# Patient Record
Sex: Female | Born: 1995 | Race: Black or African American | Hispanic: No | Marital: Single | Smoking: Never smoker
Health system: Southern US, Community
[De-identification: ages and names within clinical notes are randomized; demographics above are authoritative.]

## PROBLEM LIST (undated history)

## (undated) DIAGNOSIS — N912 Amenorrhea, unspecified: Secondary | ICD-10-CM

## (undated) DIAGNOSIS — Z8619 Personal history of other infectious and parasitic diseases: Secondary | ICD-10-CM

## (undated) HISTORY — DX: Personal history of other infectious and parasitic diseases: Z86.19

## (undated) HISTORY — PX: WISDOM TOOTH EXTRACTION: SHX21

## (undated) HISTORY — DX: Amenorrhea, unspecified: N91.2

---
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
## 2016-01-18 ENCOUNTER — Emergency Department (HOSPITAL_COMMUNITY)
Admission: EM | Admit: 2016-01-18 | Discharge: 2016-01-19 | Disposition: A | Discharge status: Home / Self Care | Payer: Medicaid Other | Attending: Emergency Medicine | Admitting: Emergency Medicine

## 2016-01-18 ENCOUNTER — Encounter (HOSPITAL_COMMUNITY): Payer: Self-pay | Admitting: *Deleted

## 2016-01-18 DIAGNOSIS — L02415 Cutaneous abscess of right lower limb: Secondary | ICD-10-CM | POA: Diagnosis present

## 2016-01-18 DIAGNOSIS — L03115 Cellulitis of right lower limb: Secondary | ICD-10-CM | POA: Insufficient documentation

## 2016-01-18 NOTE — ED Notes (Signed)
The pt is c/o an abscess for 3 days on her rt thigh laterally  lmp  None depo

## 2016-01-18 NOTE — ED Provider Notes (Signed)
CSN: [id number]     Arrival date & time 01/18/16  2316 History  By signing my name below, I, Bridgette HabermannMaria Tan, attest that this documentation has been prepared under the direction and in the presence of Dreshaun Stene, PA-C. Electronically Signed: Bridgette HabermannMaria Tan, ED Scribe. 01/18/2016. 11:49 PM.   Chief Complaint  Patient presents with  . Abscess    The history is provided by the patient. No language interpreter was used.    HPI Comments: Elizabeth Mercer is a 20 y.o. female who presents to the Emergency Department complaining of a tender, gradually enlarging skin redness on her right thigh onset 3 days ago. She states "I think I have a staph infection". Denies hx of staph infections or recurrent skin infections. She reports a small bump that has scabbed over on her posterior thigh. She denies drainage from the scab. She reports redness of the skin around the scab that has been gradually growing in size. Denies streaking. Unsure of recent bug bites or wounds to the leg. Pt does shave her legs in this area but is unsure if she has had recent razor injury. Pt has kept a band-aid on the area. She has not taken any OTC medications. Pt denies fevers, chills, nausea or vomiting. No immunocompromising disease. No other complaints today.   PCP: Tanna FurryHout, Brittany  History reviewed. No pertinent past medical history. History reviewed. No pertinent past surgical history. No family history on file. Social History  Substance Use Topics  . Smoking status: Never Smoker   . Smokeless tobacco: None  . Alcohol Use: No   OB History    No data available     Review of Systems  Constitutional: Negative for fever.  Skin: Positive for color change.  All other systems reviewed and are negative.   Allergies  Review of patient's allergies indicates no known allergies.  Home Medications   Prior to Admission medications   Medication Sig Start Date End Date Taking? Authorizing Provider  cephALEXin (KEFLEX) 500 MG capsule  Take 1 capsule (500 mg total) by mouth 2 (two) times daily. 01/19/16   Arlanda Shiplett, PA-C   BP 101/71 mmHg  Pulse 63  Temp(Src) 98.3 F (36.8 C)  Resp 16  Ht 5\' 1"  (1.549 m)  Wt 45.53 kg  BMI 18.98 kg/m2  SpO2 99% Physical Exam  Constitutional: She appears well-developed and well-nourished. No distress.  Nontoxic appearing  HENT:  Head: Normocephalic and atraumatic.  Right Ear: External ear normal.  Left Ear: External ear normal.  Eyes: Conjunctivae are normal. Right eye exhibits no discharge. Left eye exhibits no discharge. No scleral icterus.  Neck: Normal range of motion.  Cardiovascular: Normal rate.   Pulmonary/Chest: Effort normal.  Musculoskeletal: Normal range of motion.  Moves all extremities spontaneously  Neurological: She is alert. Coordination normal.  Skin: Skin is warm and dry.  1-2 mm circular scab noted to right posterior thigh just proximal to popliteal fossa. Very faint surrounding erythema of approximately 4 cm. Mildly TTP over central scab. No fluctuance, drainage, induration, streaking, vesicle, pustule or desquamation. No targetoid lesions. No abscess that I&D would be possible.   Psychiatric: She has a normal mood and affect. Her behavior is normal.  Nursing note and vitals reviewed.   ED Course  Procedures  DIAGNOSTIC STUDIES: Oxygen Saturation is 99% on RA, normal by my interpretation.    COORDINATION OF CARE: 11:47 PM Discussed treatment plan with pt at bedside which includes Rx of antibiotics and PCP follow-up and pt agreed  to plan.   MDM   Final diagnoses:  Cellulitis of right lower extremity   Pt presenting with skin rash consistent with cellulitis. Localized area of erythema without streaking, papules, vesicles or desquamation. Pt is without gross abscess for which I&D would be possible.  Area marked and pt encouraged to return if redness begins to streak, extends beyond the markings, fever or nausea/vomiting develop.  Pt is alert, oriented,  NAD, afebrile, non tachycardic, nonseptic and nontoxic appearing. Pt to be d/c on keflex with strict f/u instructions.    I personally performed the services described in this documentation, which was scribed in my presence. The recorded information has been reviewed and is accurate.     Rolm GalaStevi Adrien Shankar, PA-C 01/19/16 0012  Tomasita CrumbleAdeleke Oni, MD 01/19/16 [id number]

## 2016-01-19 MED ORDER — CEPHALEXIN 500 MG PO CAPS: 500.0000 mg | ORAL_CAPSULE | Freq: Two times a day (BID) | ORAL | Status: DC

## 2016-01-19 NOTE — Discharge Instructions (Signed)

## 2016-01-27 ENCOUNTER — Encounter (HOSPITAL_COMMUNITY): Payer: Self-pay | Admitting: *Deleted

## 2016-01-27 DIAGNOSIS — N12 Tubulo-interstitial nephritis, not specified as acute or chronic: Secondary | ICD-10-CM | POA: Diagnosis not present

## 2016-01-27 DIAGNOSIS — M545 Low back pain: Secondary | ICD-10-CM | POA: Diagnosis present

## 2016-01-27 LAB — COMPREHENSIVE METABOLIC PANEL
ALT: 15 U/L (ref 14–54)
AST: 32 U/L (ref 15–41)
Albumin: 4.2 g/dL (ref 3.5–5.0)
Alkaline Phosphatase: 75 U/L (ref 38–126)
Anion gap: 12 (ref 5–15)
BUN: 10 mg/dL (ref 6–20)
CO2: 21 mmol/L — AB (ref 22–32)
Calcium: 9.3 mg/dL (ref 8.9–10.3)
Chloride: 103 mmol/L (ref 101–111)
Creatinine, Ser: 0.91 mg/dL (ref 0.44–1.00)
GFR calc Af Amer: >60 mL/min (ref >60)
Glucose, Bld: 105 mg/dL — ABNORMAL HIGH (ref 65–99)
POTASSIUM: 3.3 mmol/L — AB (ref 3.5–5.1)
SODIUM: 136 mmol/L (ref 135–145)
Total Bilirubin: 0.8 mg/dL (ref 0.3–1.2)
Total Protein: 8.2 g/dL — ABNORMAL HIGH (ref 6.5–8.1)

## 2016-01-27 LAB — CBC WITH DIFFERENTIAL/PLATELET
Basophils Absolute: 0 10*3/uL (ref 0.0–0.1)
Basophils Relative: 0 %
EOS ABS: 0 10*3/uL (ref 0.0–0.7)
Eosinophils Relative: 0 %
HEMATOCRIT: 38.7 % (ref 36.0–46.0)
HEMOGLOBIN: 13.1 g/dL (ref 12.0–15.0)
Lymphocytes Relative: 15 %
Lymphs Abs: 0.9 10*3/uL (ref 0.7–4.0)
MCH: 29 pg (ref 26.0–34.0)
MCHC: 33.9 g/dL (ref 30.0–36.0)
MCV: 85.8 fL (ref 78.0–100.0)
Monocytes Absolute: 0.7 10*3/uL (ref 0.1–1.0)
Monocytes Relative: 11 %
NEUTROS ABS: 4.5 10*3/uL (ref 1.7–7.7)
Neutrophils Relative %: 74 %
Platelets: 261 10*3/uL (ref 150–400)
RBC: 4.51 MIL/uL (ref 3.87–5.11)
RDW: 13.3 % (ref 11.5–15.5)
WBC: 6 10*3/uL (ref 4.0–10.5)

## 2016-01-27 MED ORDER — IBUPROFEN 400 MG PO TABS
600.0000 mg | ORAL_TABLET | Freq: Once | ORAL | Status: AC
Start: 2016-01-28 — End: 2016-01-27
  Administered 2016-01-27: 600 mg via ORAL

## 2016-01-27 MED ORDER — OXYCODONE-ACETAMINOPHEN 5-325 MG PO TABS: 1.0000 | ORAL_TABLET | Freq: Once | ORAL | Status: DC

## 2016-01-27 MED ORDER — ACETAMINOPHEN 325 MG PO TABS
ORAL_TABLET | ORAL | Status: AC
  Filled 2016-01-27: qty 1

## 2016-01-27 MED ORDER — OXYCODONE-ACETAMINOPHEN 5-325 MG PO TABS
ORAL_TABLET | ORAL | Status: AC
  Filled 2016-01-27: qty 1

## 2016-01-27 MED ORDER — IBUPROFEN 200 MG PO TABS
ORAL_TABLET | ORAL | Status: AC
  Filled 2016-01-27: qty 3

## 2016-01-27 MED ORDER — ACETAMINOPHEN 325 MG PO TABS
650.0000 mg | ORAL_TABLET | Freq: Once | ORAL | Status: AC | PRN
  Administered 2016-01-27: 650 mg via ORAL

## 2016-01-27 NOTE — ED Notes (Signed)
Pt calm and resting quietly in triage room with friend at bedside

## 2016-01-27 NOTE — ED Notes (Signed)
Pt c/o sudden lower back pain, pt having panic attack in waiting room and triage. Pt is able to bend forward, arch her back, twist left and right without any problems.

## 2016-01-28 ENCOUNTER — Emergency Department (HOSPITAL_COMMUNITY)
Admission: EM | Admit: 2016-01-28 | Discharge: 2016-01-28 | Disposition: A | Discharge status: Home / Self Care | Payer: Medicaid Other | Attending: Emergency Medicine | Admitting: Emergency Medicine

## 2016-01-28 DIAGNOSIS — N12 Tubulo-interstitial nephritis, not specified as acute or chronic: Secondary | ICD-10-CM

## 2016-01-28 LAB — I-STAT CG4 LACTIC ACID, ED
Lactic Acid, Venous: 0.62 mmol/L (ref 0.5–1.9)
Lactic Acid, Venous: 1.96 mmol/L (ref 0.5–1.9)

## 2016-01-28 LAB — URINALYSIS, ROUTINE W REFLEX MICROSCOPIC
Bilirubin Urine: NEGATIVE
GLUCOSE, UA: NEGATIVE mg/dL
HGB URINE DIPSTICK: NEGATIVE
Ketones, ur: 15 mg/dL — AB
Nitrite: NEGATIVE
PH: 6.5 (ref 5.0–8.0)
Protein, ur: NEGATIVE mg/dL
Specific Gravity, Urine: 1.026 (ref 1.005–1.030)

## 2016-01-28 LAB — URINE MICROSCOPIC-ADD ON: RBC / HPF: NONE SEEN RBC/hpf (ref 0–5)

## 2016-01-28 LAB — PREGNANCY, URINE: PREG TEST UR: NEGATIVE

## 2016-01-28 MED ORDER — SODIUM CHLORIDE 0.9 % IV BOLUS (SEPSIS)
1000.0000 mL | Freq: Once | INTRAVENOUS | Status: AC
  Administered 2016-01-28: 1000 mL via INTRAVENOUS

## 2016-01-28 MED ORDER — DEXTROSE 5 % IV SOLN
1.0000 g | Freq: Once | INTRAVENOUS | Status: AC
  Administered 2016-01-28: 1 g via INTRAVENOUS
  Filled 2016-01-28: qty 10

## 2016-01-28 MED ORDER — CEPHALEXIN 500 MG PO CAPS: 500.0000 mg | ORAL_CAPSULE | Freq: Three times a day (TID) | ORAL | Status: DC

## 2016-01-28 NOTE — Discharge Instructions (Signed)

## 2016-01-28 NOTE — ED Provider Notes (Signed)
CSN: [id number]     Arrival date & time 01/27/16  2147 History  By signing my name below, I, Terrance Branch, attest that this documentation has been prepared under the direction and in the presence of Shon Baton, MD. Electronically Signed: Evon Slack, ED Scribe. 01/28/2016. 2:13 AM.    Chief Complaint  Patient presents with  . Back Pain  . Fever   Patient is a 20 y.o. female presenting with back pain and fever. The history is provided by the patient. No language interpreter was used.  Back Pain Associated symptoms: fever   Associated symptoms: no abdominal pain, no chest pain and no dysuria   Fever Associated symptoms: no chest pain, no diarrhea, no dysuria, no nausea, no sore throat and no vomiting    HPI Comments: Elizabeth Mercer is a 20 y.o. female who presents to the Emergency Department complaining of worsening low right sided back pain yesterday morning. Pt reports having HA as well. Pt states that since being in the ED she has received ibuprofen and her pain and HA has resolved. She is currently without complaint and resting comfortably. States that her back pain got progressively throughout the day.  Denies recent sick contacts. Denies dysuria, hematuria, CP, SOB, n/v/d, abdominal pain, sore throat or congestion. Denies alcohol or drug use. Denies difficulty with her bowel or bladder, weakness, numbness, tingling of the lower extremities. Denies IV drug use. She did not realize she had a fever. Noted to be febrile in triage.  History reviewed. No pertinent past medical history. History reviewed. No pertinent past surgical history. History reviewed. No pertinent family history. Social History  Substance Use Topics  . Smoking status: Never Smoker   . Smokeless tobacco: None  . Alcohol Use: No   OB History    No data available      Review of Systems  Constitutional: Positive for fever.  HENT: Negative for sore throat.   Respiratory: Negative for shortness of  breath.   Cardiovascular: Negative for chest pain.  Gastrointestinal: Negative for nausea, vomiting, abdominal pain and diarrhea.  Genitourinary: Negative for dysuria and hematuria.  Musculoskeletal: Positive for back pain. Negative for neck pain and neck stiffness.  All other systems reviewed and are negative.    Allergies  Review of patient's allergies indicates no known allergies.  Home Medications   Prior to Admission medications   Medication Sig Start Date End Date Taking? Authorizing Provider  cephALEXin (KEFLEX) 500 MG capsule Take 1 capsule (500 mg total) by mouth 3 (three) times daily. 01/28/16   Shon Baton, MD   BP 90/55 mmHg  Pulse 79  Temp(Src) 99 F (37.2 C) (Oral)  Resp 18  SpO2 100%   Physical Exam  Constitutional: She is oriented to person, place, and time. No distress.  Resting comfortably, no acute distress  HENT:  Head: Normocephalic and atraumatic.  Eyes: Pupils are equal, round, and reactive to light.  Neck: Normal range of motion. Neck supple.  Normal range of motion, no meningismus  Cardiovascular: Normal rate, regular rhythm and normal heart sounds.   No murmur heard. Pulmonary/Chest: Effort normal and breath sounds normal. No respiratory distress. She has no wheezes.  Abdominal: Soft. Bowel sounds are normal. There is no tenderness. There is no rebound.  Genitourinary:  No CVA tenderness  Neurological: She is alert and oriented to person, place, and time.  Skin: Skin is warm and dry.  Psychiatric: She has a normal mood and affect.  Nursing note and vitals  reviewed.   ED Course  Procedures (including critical care time) DIAGNOSTIC STUDIES: Oxygen Saturation is 100% on RA, normal by my interpretation.    COORDINATION OF CARE: 2:20 AM-Discussed treatment plan which includes CMP and UA with pt at bedside and pt agreed to plan.     Labs Review Labs Reviewed  COMPREHENSIVE METABOLIC PANEL - Abnormal; Notable for the following:     Potassium 3.3 (*)    CO2 21 (*)    Glucose, Bld 105 (*)    Total Protein 8.2 (*)    All other components within normal limits  URINALYSIS, ROUTINE W REFLEX MICROSCOPIC (NOT AT [facility]) - Abnormal; Notable for the following:    APPearance CLOUDY (*)    Ketones, ur 15 (*)    Leukocytes, UA MODERATE (*)    All other components within normal limits  URINE MICROSCOPIC-ADD ON - Abnormal; Notable for the following:    Squamous Epithelial / LPF 0-5 (*)    Bacteria, UA RARE (*)    All other components within normal limits  I-STAT CG4 LACTIC ACID, ED - Abnormal; Notable for the following:    Lactic Acid, Venous 1.96 (*)    All other components within normal limits  URINE CULTURE  URINE CULTURE  CBC WITH DIFFERENTIAL/PLATELET  PREGNANCY, URINE  POC URINE PREG, ED  I-STAT CG4 LACTIC ACID, ED  I-STAT CG4 LACTIC ACID, ED    Imaging Review No results found. I have personally reviewed and evaluated these images and lab results as part of my medical decision-making.   EKG Interpretation None      MDM   Final diagnoses:  Pyelonephritis    Patient presents with back pain. Ongoing worsening over the last day. Was noted to be febrile in triage. Initial temperature 101.4. Subsequent temperature 102.8. Noted to be tachycardic. She was given ibuprofen and Tylenol in triage. She is currently completely asymptomatic. Only other associated symptoms or headache in the setting of fever. No neck stiffness. No upper respiratory symptoms. No abdominal symptoms. Patient was noted to be borderline hypotensive. Her normal blood pressures from 95-105 systolic based on chart review.  She had an isolated low blood pressures 74/48.  Repeat blood pressures closer to baseline. She only weighs 100 pounds. She was given 1 L of fluid. Feel her blood pressures are likely within normal range or just below given acute illness. Lab work is largely reassuring. No leukocytosis.  Urinalysis with moderate leukocyte esterase, 6-30  white cells and rare bacteria. Urine culture sent. Given back pain and fever, will treat for pyelonephritis. Also noted to have ketones in the urine. Blood pressure could reflect mild dehydration. On multiple rechecks, patient remains persistently asymptomatic in his room resting comfortably with a reassuring exam. Will discharge home on Keflex. Patient was given strict return precautions.  After history, exam, and medical workup I feel the patient has been appropriately medically screened and is safe for discharge home. Pertinent diagnoses were discussed with the patient. Patient was given return precautions.  I personally performed the services described in this documentation, which was scribed in my presence. The recorded information has been reviewed and is accurate.      Shon Batonourtney F Horton, MD 01/28/16 [phone]

## 2016-01-29 LAB — URINE CULTURE

## 2016-03-22 ENCOUNTER — Emergency Department (HOSPITAL_COMMUNITY)
Admission: EM | Admit: 2016-03-22 | Discharge: 2016-03-22 | Disposition: A | Discharge status: Home / Self Care | Payer: Medicaid Other | Attending: Emergency Medicine | Admitting: Emergency Medicine

## 2016-03-22 ENCOUNTER — Encounter (HOSPITAL_COMMUNITY): Payer: Self-pay | Admitting: Vascular Surgery

## 2016-03-22 DIAGNOSIS — N939 Abnormal uterine and vaginal bleeding, unspecified: Secondary | ICD-10-CM | POA: Diagnosis present

## 2016-03-22 DIAGNOSIS — R102 Pelvic and perineal pain: Secondary | ICD-10-CM | POA: Diagnosis not present

## 2016-03-22 LAB — I-STAT CHEM 8, ED
BUN: 11 mg/dL (ref 6–20)
CALCIUM ION: 1.18 mmol/L (ref 1.15–1.40)
CREATININE: 1 mg/dL (ref 0.44–1.00)
Chloride: 103 mmol/L (ref 101–111)
GLUCOSE: 78 mg/dL (ref 65–99)
HCT: 36 % (ref 36.0–46.0)
HEMOGLOBIN: 12.2 g/dL (ref 12.0–15.0)
Potassium: 3.6 mmol/L (ref 3.5–5.1)
Sodium: 139 mmol/L (ref 135–145)
TCO2: 24 mmol/L (ref 0–100)

## 2016-03-22 LAB — WET PREP, GENITAL
Clue Cells Wet Prep HPF POC: NONE SEEN
Sperm: NONE SEEN
Trich, Wet Prep: NONE SEEN
YEAST WET PREP: NONE SEEN

## 2016-03-22 LAB — I-STAT BETA HCG BLOOD, ED (MC, WL, AP ONLY): I-stat hCG, quantitative: <5 m[IU]/mL (ref <5)

## 2016-03-22 NOTE — ED Provider Notes (Signed)
[facility]EMERGENCY DEPT Provider Note   CSN: [id number] Arrival date & time: 03/22/16  1605     History   Chief Complaint Chief Complaint  Patient presents with  . Vaginal Bleeding    HPI Elizabeth Mercer is a 20 y.o. female.  Patient presents to the ED with a chief complaint of vaginal bleeding.  She states that she noticed the bleeding 3-4 days ago and that it has been persistent.  She does not use pads or tampons.  She denies any SOB, lightheadedness, dizziness.  She states that she has not had a period in over a year because she takes depo.  She states that she had some cramping at the onset of the bleeding, but does not have any pain now.  She denies any dysuria.   The history is provided by the patient. No language interpreter was used.    History reviewed. No pertinent past medical history.  There are no active problems to display for this patient.   History reviewed. No pertinent surgical history.  OB History    No data available       Home Medications    Prior to Admission medications   Medication Sig Start Date End Date Taking? Authorizing Provider  cephALEXin (KEFLEX) 500 MG capsule Take 1 capsule (500 mg total) by mouth 3 (three) times daily. 01/28/16   Shon Baton, MD    Family History No family history on file.  Social History Social History  Substance Use Topics  . Smoking status: Never Smoker  . Smokeless tobacco: Never Used  . Alcohol use No     Allergies   Review of patient's allergies indicates no known allergies.   Review of Systems Review of Systems  Genitourinary: Positive for vaginal bleeding.  All other systems reviewed and are negative.    Physical Exam Updated Vital Signs BP 116/84 (BP Location: Right Arm)   Pulse 99   Temp 98.9 F (37.2 C) (Oral)   Resp 20   SpO2 100%   Physical Exam  Constitutional: She is oriented to person, place, and time. She appears well-developed and well-nourished.  HENT:  Head:  Normocephalic and atraumatic.  Eyes: Conjunctivae and EOM are normal. Pupils are equal, round, and reactive to light.  Neck: Normal range of motion. Neck supple.  Cardiovascular: Normal rate and regular rhythm.  Exam reveals no gallop and no friction rub.   No murmur heard. Pulmonary/Chest: Effort normal and breath sounds normal. No respiratory distress. She has no wheezes. She has no rales. She exhibits no tenderness.  Abdominal: Soft. Bowel sounds are normal. She exhibits no distension and no mass. There is no tenderness. There is no rebound and no guarding.  Genitourinary:  Genitourinary Comments: Pelvic exam chaperoned by female ER tech, no right or left adnexal tenderness, no uterine tenderness, no vaginal discharge, no bleeding, no CMT or friability, no foreign body, no injury to the external genitalia, no other significant findings   Musculoskeletal: Normal range of motion. She exhibits no edema or tenderness.  Neurological: She is alert and oriented to person, place, and time.  Skin: Skin is warm and dry.  Psychiatric: She has a normal mood and affect. Her behavior is normal. Judgment and thought content normal.  Nursing note and vitals reviewed.    ED Treatments / Results  Labs (all labs ordered are listed, but only abnormal results are displayed) Labs Reviewed  WET PREP, GENITAL  URINALYSIS, ROUTINE W REFLEX MICROSCOPIC (NOT AT [facility])  I-STAT CHEM 8,  ED  GC/CHLAMYDIA PROBE AMP ([place]) NOT AT [facility]    EKG  EKG Interpretation None       Radiology No results found.  Procedures Procedures (including critical care time)  Medications Ordered in ED Medications - No data to display   Initial Impression / Assessment and Plan / ED Course  I have reviewed the triage vital signs and the nursing notes.  Pertinent labs & imaging results that were available during my care of the patient were reviewed by me and considered in my medical decision making (see chart for  details).  Clinical Course    Patient with abnormal uterine bleeding.  No bleeding on exam.  No pain. Preg negative.  H/H is stable.  DC to home with PCP follow-up.  Final Clinical Impressions(s) / ED Diagnoses   Final diagnoses:  Abnormal uterine bleeding (AUB)    New Prescriptions New Prescriptions   No medications on file     Roxy HorsemanRobert Beverlyann Broxterman, PA-C 03/22/16 1903    Maia PlanJoshua G Long, MD 03/23/16 1728

## 2016-03-22 NOTE — ED Triage Notes (Signed)
Pt reports to the ED for eval of vaginal bleeding x 3-4 days. She reports she also had some vaginal pain at onset. Pt reports she does not think it is her menstrual cycle because she hasn't had a period since she stopped taking the Depo shot in November 2016. Pt denies any recent unprotected sex. Denies any abd pain or N/V/D.

## 2016-03-22 NOTE — ED Notes (Signed)
Pt ambulated to room from waiting room. Pt was unable to void at this time. Pelvic cart setup at bedside.

## 2016-03-23 LAB — GC/CHLAMYDIA PROBE AMP (~~LOC~~) NOT AT ARMC
Chlamydia: POSITIVE — AB
NEISSERIA GONORRHEA: POSITIVE — AB

## 2016-03-24 ENCOUNTER — Telehealth (HOSPITAL_BASED_OUTPATIENT_CLINIC_OR_DEPARTMENT_OTHER): Payer: Self-pay | Admitting: Emergency Medicine

## 2016-03-24 NOTE — Telephone Encounter (Signed)
Chart handoff to EDP for treatment plan for GC and Chlamydia 

## 2016-03-25 ENCOUNTER — Telehealth (HOSPITAL_BASED_OUTPATIENT_CLINIC_OR_DEPARTMENT_OTHER): Payer: Self-pay | Admitting: *Deleted

## 2016-03-25 NOTE — Telephone Encounter (Signed)
Spoke with patient, verified ID, informed of labs, advised return to ED/health department for treatment and notify sexual partners for evaluation and treatment.

## 2016-12-31 ENCOUNTER — Emergency Department (HOSPITAL_COMMUNITY)
Admission: EM | Admit: 2016-12-31 | Discharge: 2016-12-31 | Disposition: A | Discharge status: Home / Self Care | Payer: Medicaid Other | Attending: Emergency Medicine | Admitting: Emergency Medicine

## 2016-12-31 ENCOUNTER — Encounter (HOSPITAL_COMMUNITY): Payer: Self-pay

## 2016-12-31 DIAGNOSIS — Z3202 Encounter for pregnancy test, result negative: Secondary | ICD-10-CM | POA: Insufficient documentation

## 2016-12-31 DIAGNOSIS — R112 Nausea with vomiting, unspecified: Secondary | ICD-10-CM | POA: Diagnosis not present

## 2016-12-31 LAB — HCG, SERUM, QUALITATIVE: Preg, Serum: NEGATIVE

## 2016-12-31 LAB — POC URINE PREG, ED: PREG TEST UR: NEGATIVE

## 2016-12-31 NOTE — Discharge Instructions (Signed)
Your urine and blood tests were negative today.   As we discussed, it could still be too early to completely rule out a pregnancy (although it is very unlikely).  Monitor for your next menstrual cycle, if you get it on time you can be more sure you are not pregnant. If you miss your period, retake another pregnancy test at home.   Return to ED if you develop a positive pregnancy test with severe abdominal pain and vaginal bleeding.   Contact the clinics listed below to establish care with a medical provider to discuss birth control options to prevent unwanted pregnancies and sexual transmitted diseases.

## 2016-12-31 NOTE — ED Triage Notes (Signed)
Pt reports she wants to know if she is pregnant. She states she took a home pregnancy test which came back positive. LMP may 18th. Denies any pain.

## 2016-12-31 NOTE — ED Provider Notes (Signed)
[facility] Provider Note   CSN: [id number] Arrival date & time: 12/31/16  4098     History   Chief Complaint Chief Complaint  Patient presents with  . Possible Pregnancy    HPI Elizabeth Mercer is a 21 y.o. female with no past medical history presents to the ED with a positive pregnancy test at home. She is sexually active with men only without condom or any other birth control methods. She is having intermittent, mild nausea and cramping for the past week, she vomited once yesterday. Denies fevers, chest pain, abdominal pain, pelvic pain, urinary symptoms, vaginal bleeding. Frequent use of marijuana, denies EtOH or tobacco use. LMP 12/03/2016-12/06/2016.  HPI  History reviewed. No pertinent past medical history.  There are no active problems to display for this patient.   History reviewed. No pertinent surgical history.  OB History    No data available       Home Medications    Prior to Admission medications   Not on File    Family History No family history on file.  Social History Social History  Substance Use Topics  . Smoking status: Never Smoker  . Smokeless tobacco: Never Used  . Alcohol use No     Allergies   Patient has no known allergies.   Review of Systems Review of Systems  Constitutional: Negative for fever.  Cardiovascular: Negative for chest pain.  Gastrointestinal: Positive for nausea and vomiting. Negative for abdominal pain, constipation and diarrhea.  Genitourinary: Negative for difficulty urinating, dysuria, flank pain, frequency, hematuria and pelvic pain.       +cramping  Musculoskeletal: Negative for back pain.  Skin: Negative for color change.  Neurological: Negative for syncope, light-headedness and headaches.     Physical Exam Updated Vital Signs BP 110/75 (BP Location: Right Arm)   Pulse 82   Temp 98.3 F (36.8 C) (Oral)   Resp 16   Ht 5\' 1"  (1.549 m)   Wt 45.4 kg (100 lb)   LMP 12/03/2016   SpO2 100%   BMI  18.89 kg/m   Physical Exam  Constitutional: She is oriented to person, place, and time. She appears well-developed and well-nourished. No distress.  NAD.  HENT:  Head: Normocephalic and atraumatic.  Right Ear: External ear normal.  Left Ear: External ear normal.  Nose: Nose normal.  Eyes: Conjunctivae and EOM are normal. No scleral icterus.  Neck: Normal range of motion. Neck supple.  Cardiovascular: Normal rate, regular rhythm and normal heart sounds.   No murmur heard. Pulmonary/Chest: Effort normal and breath sounds normal. She has no wheezes.  Musculoskeletal: Normal range of motion. She exhibits no deformity.  Neurological: She is alert and oriented to person, place, and time.  Skin: Skin is warm and dry. Capillary refill takes less than 2 seconds.  Psychiatric: She has a normal mood and affect. Her behavior is normal. Judgment and thought content normal.  Nursing note and vitals reviewed.    ED Treatments / Results  Labs (all labs ordered are listed, but only abnormal results are displayed) Labs Reviewed  HCG, SERUM, QUALITATIVE  POC URINE PREG, ED    EKG  EKG Interpretation None       Radiology No results found.  Procedures Procedures (including critical care time)  Medications Ordered in ED Medications - No data to display   Initial Impression / Assessment and Plan / ED Course  I have reviewed the triage vital signs and the nursing notes.  Pertinent labs & imaging results  that were available during my care of the patient were reviewed by me and considered in my medical decision making (see chart for details).    21 year old healthy female presents requesting pregnancy test. She took a pregnancy test at home which was positive. She is sexually active with men only and has been expressing mild intermittent nausea, vomited one time. No other constitutional symptoms or abdominal pain, urinary symptoms or vaginal bleeding. Her next menstrual cycle is due in 3  days.  Physical exam is reassuring. No suprapubic, CVA or abdominal tenderness. Urine pregnancy negative. Beta hCG serum negative. Discussed results with patient. She is very relieved. No indication for further emergent lab work or imaging today. Provided patient education on birth control methods to avoid unwanted pregnancy and sexually transmitted diseases. Patient is interested. Provided [facility] and health department contact information. Patient considered safe for discharge at this time.  Final Clinical Impressions(s) / ED Diagnoses   Final diagnoses:  Encounter for pregnancy test with result negative    New Prescriptions There are no discharge medications for this patient.    Liberty HandyGibbons, Rontavious Albright J, PA-C 12/31/16 1023    Margarita Grizzleay, Danielle, MD 12/31/16 [phone]

## 2017-03-17 ENCOUNTER — Emergency Department (HOSPITAL_COMMUNITY)
Admission: EM | Admit: 2017-03-17 | Discharge: 2017-03-17 | Disposition: A | Discharge status: Home / Self Care | Payer: Medicaid Other | Attending: Emergency Medicine | Admitting: Emergency Medicine

## 2017-03-17 ENCOUNTER — Emergency Department (HOSPITAL_COMMUNITY): Payer: Medicaid Other

## 2017-03-17 ENCOUNTER — Encounter (HOSPITAL_COMMUNITY): Payer: Self-pay

## 2017-03-17 DIAGNOSIS — S60111A Contusion of right thumb with damage to nail, initial encounter: Secondary | ICD-10-CM | POA: Insufficient documentation

## 2017-03-17 DIAGNOSIS — W231XXA Caught, crushed, jammed, or pinched between stationary objects, initial encounter: Secondary | ICD-10-CM | POA: Insufficient documentation

## 2017-03-17 DIAGNOSIS — Y939 Activity, unspecified: Secondary | ICD-10-CM | POA: Insufficient documentation

## 2017-03-17 DIAGNOSIS — Y929 Unspecified place or not applicable: Secondary | ICD-10-CM | POA: Insufficient documentation

## 2017-03-17 DIAGNOSIS — Y998 Other external cause status: Secondary | ICD-10-CM | POA: Insufficient documentation

## 2017-03-17 DIAGNOSIS — S60011A Contusion of right thumb without damage to nail, initial encounter: Secondary | ICD-10-CM

## 2017-03-17 DIAGNOSIS — S6010XA Contusion of unspecified finger with damage to nail, initial encounter: Secondary | ICD-10-CM

## 2017-03-17 MED ORDER — IBUPROFEN 600 MG PO TABS: 600.0000 mg | ORAL_TABLET | Freq: Four times a day (QID) | ORAL | 0 refills | Status: DC | PRN

## 2017-03-17 MED ORDER — IBUPROFEN 400 MG PO TABS: 600.0000 mg | ORAL_TABLET | Freq: Once | ORAL | Status: DC

## 2017-03-17 NOTE — Discharge Instructions (Signed)
Read the information below.  Use the prescribed medication as directed.  Please discuss all new medications with your pharmacist.  You may return to the Emergency Department at any time for worsening condition or any new symptoms that concern you.    If you develop uncontrolled pain, weakness or numbness of the extremity, severe discoloration of the skin, or you are unable to move your finger, return to the ER for a recheck.    °

## 2017-03-17 NOTE — ED Provider Notes (Signed)
[facility]-EMERGENCY DEPT Provider Note   CSN: [id number] Arrival date & time: 03/17/17  1034     History   Chief Complaint Chief Complaint  Patient presents with  . Finger Injury    HPI Elizabeth Mercer is a 21 y.o. female.  HPI   Patient presents with injury to her right thumb that occurred yesterday when she accidentally slammed her thumb in the door.  She denies any other injury.  No weakness or numbness.  She works as a Production designer, theatre/television/filmmanager at [employer].    History reviewed. No pertinent past medical history.  There are no active problems to display for this patient.   History reviewed. No pertinent surgical history.  OB History    No data available       Home Medications    Prior to Admission medications   Medication Sig Start Date End Date Taking? Authorizing Provider  ibuprofen (ADVIL,MOTRIN) 600 MG tablet Take 1 tablet (600 mg total) by mouth every 6 (six) hours as needed for mild pain or moderate pain. 03/17/17   Trixie DredgeWest, Helmuth Recupero, PA-C    Family History History reviewed. No pertinent family history.  Social History Social History  Substance Use Topics  . Smoking status: Never Smoker  . Smokeless tobacco: Never Used  . Alcohol use No     Allergies   Patient has no known allergies.   Review of Systems Review of Systems  Constitutional: Negative for chills and fever.  Musculoskeletal: Negative for joint swelling and myalgias.  Skin: Positive for color change. Negative for pallor, rash and wound.  Neurological: Negative for weakness and numbness.  Hematological: Does not bruise/bleed easily.  Psychiatric/Behavioral: Negative for self-injury.     Physical Exam Updated Vital Signs BP 102/67 (BP Location: Left Arm)   Pulse 66   Temp 98.7 F (37.1 C) (Oral)   Resp 12   LMP 03/17/2017   SpO2 100%   Physical Exam  Constitutional: She appears well-developed and well-nourished. No distress.  HENT:  Head: Normocephalic and atraumatic.  Neck: Neck supple.    Pulmonary/Chest: Effort normal.  Musculoskeletal:  Right thumb with edema, tenderness, mild erythema over distal phalanx.  Nailbed with subungual hematoma over approximately 1/5 proximal nail.  No break in skin.  Full AROM of all joints, distal sensation intact, capillary refill < 2 seconds.  No other noted tenderness throughout right upper extremity.    Neurological: She is alert.  Skin: She is not diaphoretic.  Nursing note and vitals reviewed.    ED Treatments / Results  Labs (all labs ordered are listed, but only abnormal results are displayed) Labs Reviewed - No data to display  EKG  EKG Interpretation None       Radiology Dg Finger Thumb Right  Result Date: 03/17/2017 CLINICAL DATA:  21 year old female slammed right thumb in car door today with pain. EXAM: RIGHT THUMB 2+V COMPARISON:  Right hand series 11/26/15. FINDINGS: Bone mineralization is within normal limits. Joint spaces and alignment are stable and within normal limits. The right thumb metacarpal and phalanges appear intact. There does appear to be mild regional soft tissue swelling. Other visible osseous structures appear intact. IMPRESSION: No acute fracture or dislocation identified about the right thumb. Electronically Signed   By: Odessa FlemingH  Hall M.D.   On: 03/17/2017 11:45    Procedures Procedures (including critical care time)  Medications Ordered in ED Medications  ibuprofen (ADVIL,MOTRIN) tablet 600 mg (not administered)     Initial Impression / Assessment and Plan / ED Course  I have reviewed the triage vital signs and the nursing notes.  Pertinent labs & imaging results that were available during my care of the patient were reviewed by me and considered in my medical decision making (see chart for details).     Afebrile, nontoxic patient with injury to her right distal thumb .  No break in skin.  No fracture.  Clnically no compartment syndrome.   Xray negative.   D/C home with finger splint, motrin,  recommendations for ice, elevation.  Discussed result, findings, treatment, and follow up  with patient.  Pt given return precautions.  Pt verbalizes understanding and agrees with plan.      Final Clinical Impressions(s) / ED Diagnoses   Final diagnoses:  Contusion of right thumb without damage to nail, initial encounter  Subungual hematoma of digit of hand, initial encounter    New Prescriptions Discharge Medication List as of 03/17/2017 12:37 PM    START taking these medications   Details  ibuprofen (ADVIL,MOTRIN) 600 MG tablet Take 1 tablet (600 mg total) by mouth every 6 (six) hours as needed for mild pain or moderate pain., Starting Thu 03/17/2017, [address] [place], PA-C 03/17/17 1507    Tilden Fossa, MD 03/18/17 [phone]

## 2017-03-17 NOTE — ED Triage Notes (Signed)
Pt states she slammed her right thumb in the car door today. Some bruising under the nail noted. Moderate radial pulse noted.

## 2017-07-17 IMAGING — CR DG CHEST 2V
2 series · 2 of 2 positions shown · non-contrast
Comparison: None.

CLINICAL DATA: Chest pain and abdominal pain since 6 p.m. tonight.

EXAM:
CHEST  2 VIEW

[chest pa]
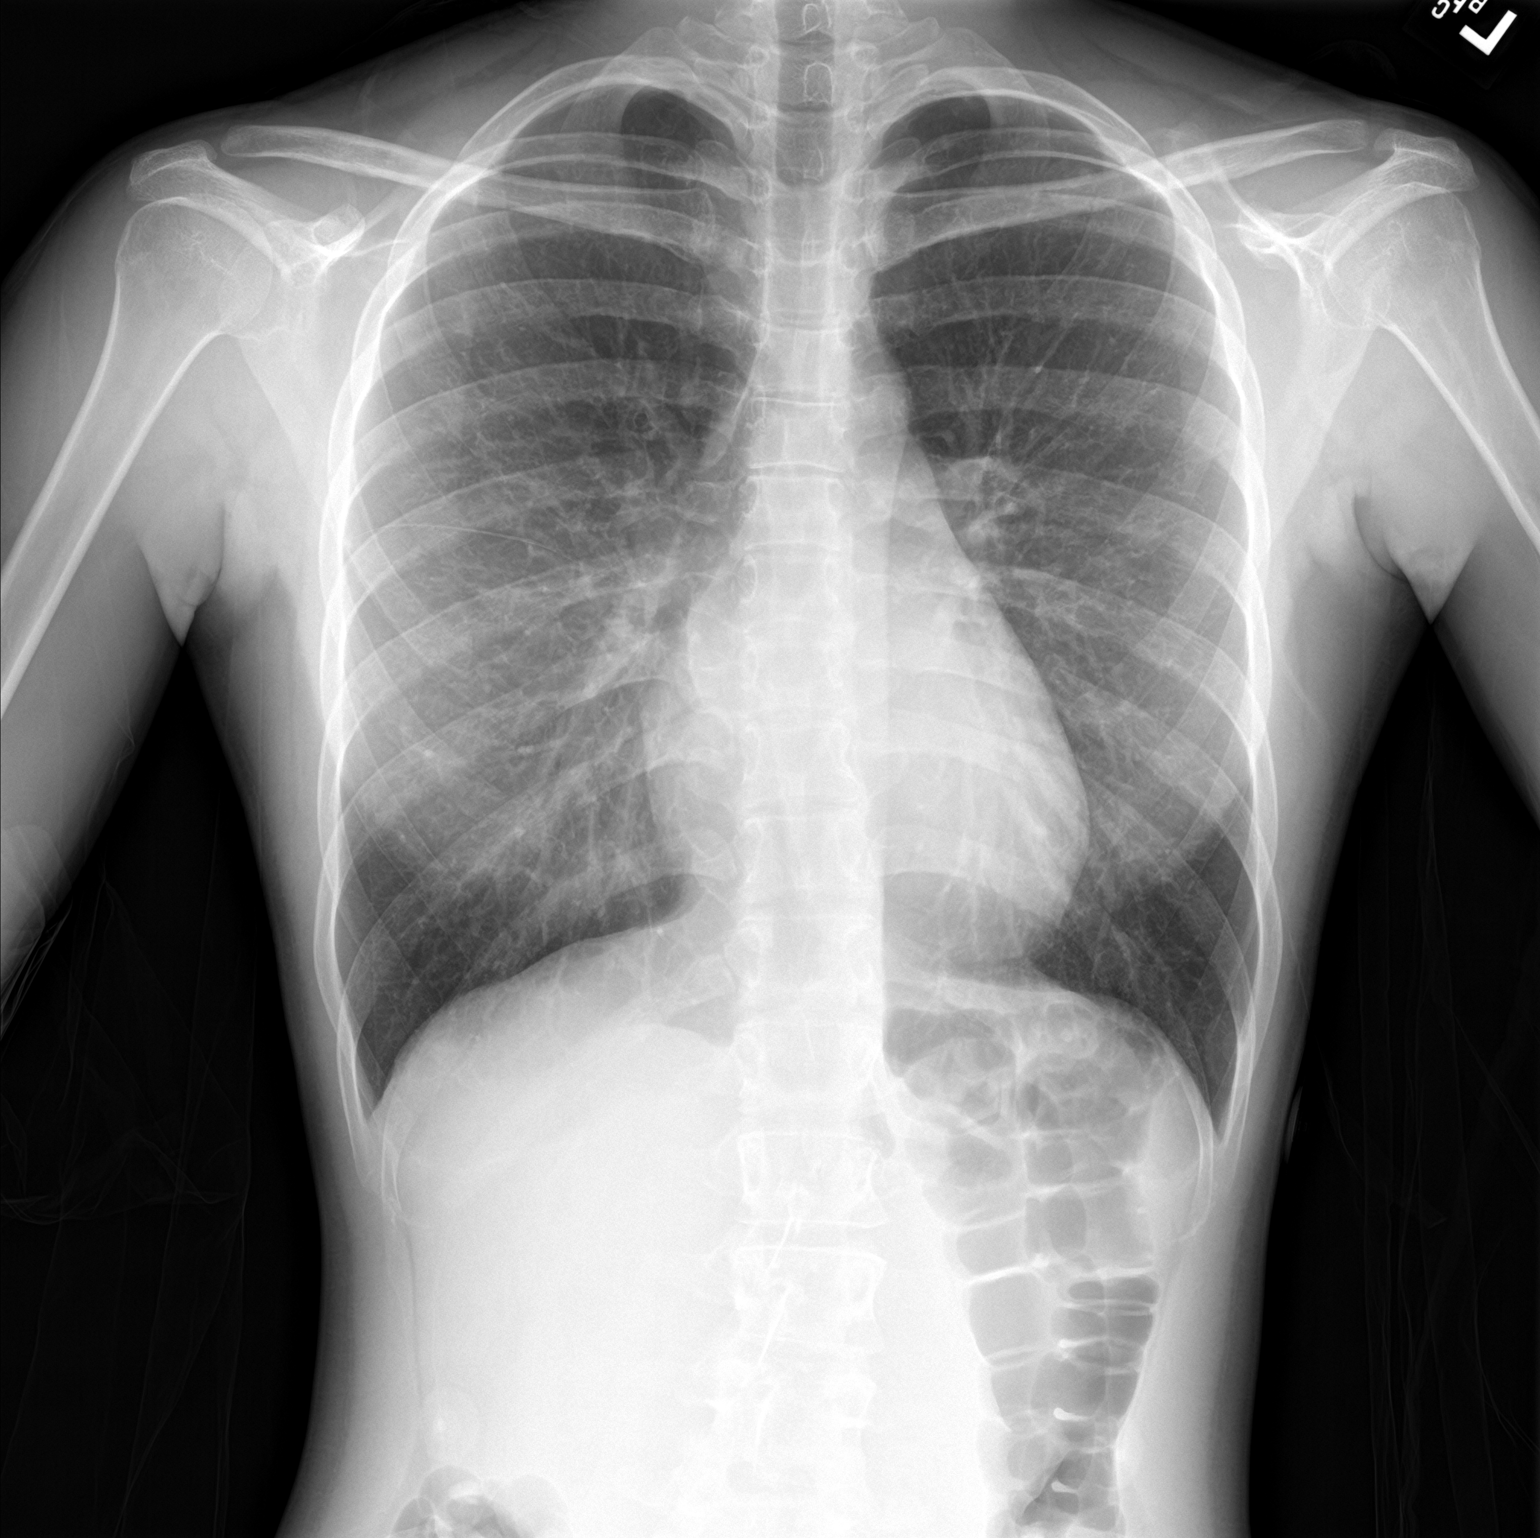

[chest lat]
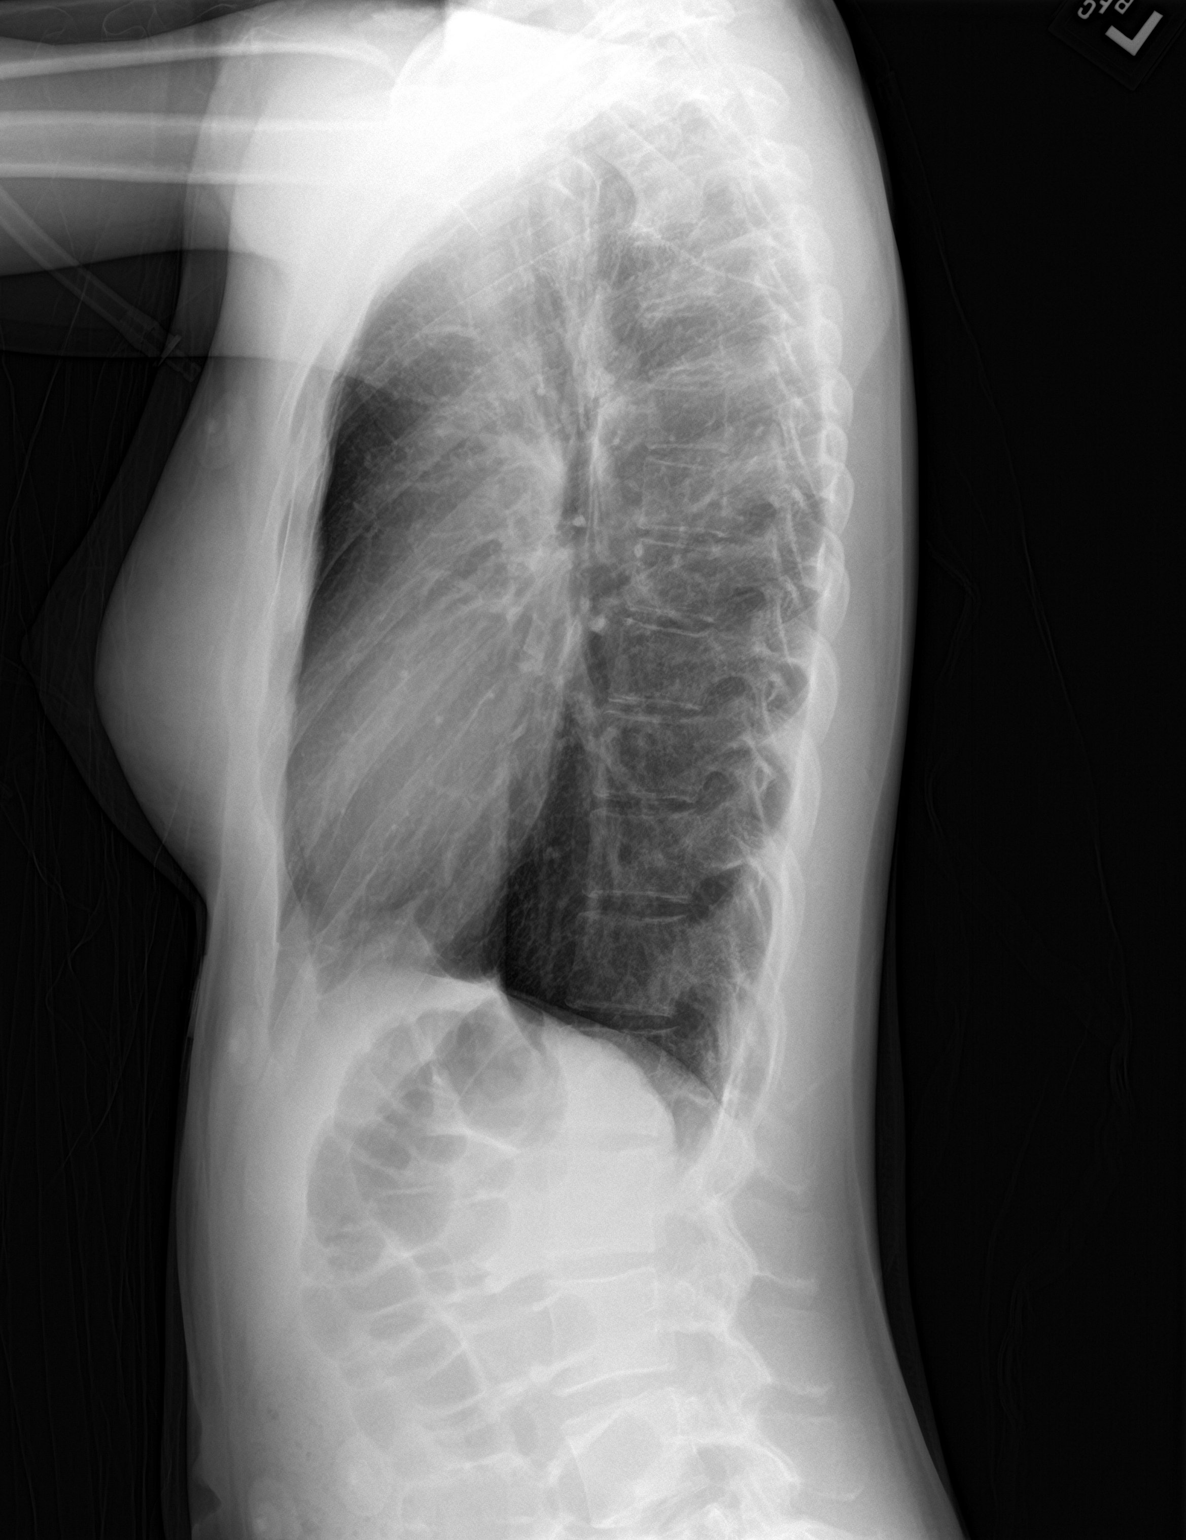

[2 of 2 positions shown; findings below may reference images not displayed]

FINDINGS: The heart size and mediastinal contours are within normal limits.
Both lungs are clear. The visualized skeletal structures are
unremarkable.
IMPRESSION: No active cardiopulmonary disease.

## 2017-08-17 ENCOUNTER — Encounter (HOSPITAL_COMMUNITY): Payer: Self-pay | Admitting: Emergency Medicine

## 2017-08-17 ENCOUNTER — Emergency Department (HOSPITAL_COMMUNITY)
Admission: EM | Admit: 2017-08-17 | Discharge: 2017-08-17 | Disposition: A | Discharge status: Home / Self Care | Payer: Self-pay | Attending: Emergency Medicine | Admitting: Emergency Medicine

## 2017-08-17 ENCOUNTER — Other Ambulatory Visit: Payer: Self-pay

## 2017-08-17 DIAGNOSIS — B9689 Other specified bacterial agents as the cause of diseases classified elsewhere: Secondary | ICD-10-CM | POA: Insufficient documentation

## 2017-08-17 DIAGNOSIS — N760 Acute vaginitis: Secondary | ICD-10-CM | POA: Insufficient documentation

## 2017-08-17 DIAGNOSIS — B3731 Acute candidiasis of vulva and vagina: Secondary | ICD-10-CM

## 2017-08-17 DIAGNOSIS — B373 Candidiasis of vulva and vagina: Secondary | ICD-10-CM | POA: Insufficient documentation

## 2017-08-17 LAB — URINALYSIS, ROUTINE W REFLEX MICROSCOPIC
BILIRUBIN URINE: NEGATIVE
GLUCOSE, UA: NEGATIVE mg/dL
HGB URINE DIPSTICK: NEGATIVE
KETONES UR: 5 mg/dL — AB
NITRITE: NEGATIVE
PROTEIN: 30 mg/dL — AB
Specific Gravity, Urine: 1.029 (ref 1.005–1.030)
pH: 5 (ref 5.0–8.0)

## 2017-08-17 LAB — WET PREP, GENITAL
Sperm: NONE SEEN
Trich, Wet Prep: NONE SEEN

## 2017-08-17 LAB — GC/CHLAMYDIA PROBE AMP (~~LOC~~) NOT AT ARMC
Chlamydia: POSITIVE — AB
NEISSERIA GONORRHEA: NEGATIVE

## 2017-08-17 LAB — PREGNANCY, URINE: Preg Test, Ur: NEGATIVE

## 2017-08-17 MED ORDER — METRONIDAZOLE 500 MG PO TABS: 500.0000 mg | ORAL_TABLET | Freq: Two times a day (BID) | ORAL | 0 refills | Status: DC

## 2017-08-17 MED ORDER — FLUCONAZOLE 150 MG PO TABS: 150.0000 mg | ORAL_TABLET | Freq: Once | ORAL | 0 refills | Status: DC | PRN

## 2017-08-17 NOTE — ED Provider Notes (Signed)
[facility] EMERGENCY DEPARTMENT Provider Note   CSN: [id number] Arrival date & time: 08/17/17  0357     History   Chief Complaint Chief Complaint  Patient presents with  . Vaginal Itching    HPI Elizabeth Mercer is a 22 y.o. female.  The history is provided by the patient and medical records.  Vaginal Itching      22 year old female presenting to the ED with vaginal itching.  Reports last sexual intercourse about 1 week ago, did use condom which she generally does not do.  States it was latex.  Reports since then she has had some vaginal itching and irritation.  She did notice a little white vaginal discharge when giving urine sample here in the ED.  She denies any abdominal pain, pelvic pain, dysuria, or hematuria.  She has no history of STDs in the past.  History reviewed. No pertinent past medical history.  There are no active problems to display for this patient.   History reviewed. No pertinent surgical history.  OB History    No data available       Home Medications    Prior to Admission medications   Medication Sig Start Date End Date Taking? Authorizing Provider  ibuprofen (ADVIL,MOTRIN) 600 MG tablet Take 1 tablet (600 mg total) by mouth every 6 (six) hours as needed for mild pain or moderate pain. Patient not taking: Reported on 08/17/2017 03/17/17   Trixie Dredge, PA-C    Family History No family history on file.  Social History Social History   Tobacco Use  . Smoking status: Never Smoker  . Smokeless tobacco: Never Used  Substance Use Topics  . Alcohol use: No  . Drug use: No     Allergies   Patient has no known allergies.   Review of Systems Review of Systems  Genitourinary: Positive for vaginal discharge and vaginal pain (itching).  All other systems reviewed and are negative.    Physical Exam Updated Vital Signs BP 117/81 (BP Location: Right Arm)   Pulse 67   Temp 97.8 F (36.6 C) (Oral)   Resp 16   Ht 5\' 1"   (1.549 m)   Wt 45.4 kg (100 lb)   SpO2 100%   BMI 18.89 kg/m   Physical Exam  Constitutional: She is oriented to person, place, and time. She appears well-developed and well-nourished.  HENT:  Head: Normocephalic and atraumatic.  Mouth/Throat: Oropharynx is clear and moist.  Eyes: Conjunctivae and EOM are normal. Pupils are equal, round, and reactive to light.  Neck: Normal range of motion.  Cardiovascular: Normal rate, regular rhythm and normal heart sounds.  Pulmonary/Chest: Effort normal and breath sounds normal.  Abdominal: Soft. Bowel sounds are normal.  Genitourinary:  Genitourinary Comments: Exam chaperoned by RN Normal female external genitalia without visible lesion or rash, large amount of thick, curd-like vaginal discharge, no bleeding, no adnexal or cervical motion tenderness  Musculoskeletal: Normal range of motion.  Neurological: She is alert and oriented to person, place, and time.  Skin: Skin is warm and dry.  Psychiatric: She has a normal mood and affect.  Nursing note and vitals reviewed.    ED Treatments / Results  Labs (all labs ordered are listed, but only abnormal results are displayed) Labs Reviewed  WET PREP, GENITAL - Abnormal; Notable for the following components:      Result Value   Yeast Wet Prep HPF POC PRESENT (*)    Clue Cells Wet Prep HPF POC PRESENT (*)  WBC, Wet Prep HPF POC MANY (*)    All other components within normal limits  URINALYSIS, ROUTINE W REFLEX MICROSCOPIC - Abnormal; Notable for the following components:   APPearance CLOUDY (*)    Ketones, ur 5 (*)    Protein, ur 30 (*)    Leukocytes, UA MODERATE (*)    Bacteria, UA RARE (*)    Squamous Epithelial / LPF 6-30 (*)    All other components within normal limits  PREGNANCY, URINE  GC/CHLAMYDIA PROBE AMP ([place]) NOT AT [facility]    EKG  EKG Interpretation None       Radiology No results found.  Procedures Procedures (including critical care time)  Medications  Ordered in ED Medications - No data to display   Initial Impression / Assessment and Plan / ED Course  I have reviewed the triage vital signs and the nursing notes.  Pertinent labs & imaging results that were available during my care of the patient were reviewed by me and considered in my medical decision making (see chart for details).  22 year old female who with vaginal irritation.  Does report some new discharge upon arrival to the ED.  Abdomen is soft and benign.  Denies any pelvic pain.  Pelvic exam performed, large amount of thick, curd-like vaginal discharge on exam consistent with yeast.  Wet prep confirms, also presence of clue cells.  Gonorrhea and chlamydia swab pending but no expressed concern for STD.  Will treat for yeast and BV with Diflucan and Flagyl.  Discussed not drinking alcohol while taking Flagyl.  Can follow-up with women's clinic if any ongoing issues.  Patient discharged home in stable condition.  Final Clinical Impressions(s) / ED Diagnoses   Final diagnoses:  None    ED Discharge Orders    None       Garlon HatchetSanders, Lenis Nettleton M, PA-C 08/17/17 [id number]    Zadie RhineWickline, Donald, MD 08/17/17 0630

## 2017-08-17 NOTE — Discharge Instructions (Signed)
Take the prescribed medication as directed.  Do not drink alcohol while taking flagyl. Follow-up with women's clinic if any ongoing issues. Return to the ED for new or worsening symptoms.

## 2017-08-17 NOTE — ED Triage Notes (Signed)
Pt reports vaginal itching onset yesterday, pt states she thinks she had a reaction to a condom... Had sexual intercourse 1 week ago. Pt also reports vaginal DC but states it is not much different from her normal, no foul odor.

## 2017-08-17 NOTE — ED Notes (Signed)
Pelvic cart set up ready to go

## 2017-08-19 ENCOUNTER — Telehealth: Payer: Self-pay | Admitting: Medical

## 2017-08-19 DIAGNOSIS — A749 Chlamydial infection, unspecified: Secondary | ICD-10-CM

## 2017-08-19 MED ORDER — AZITHROMYCIN 250 MG PO TABS: 1000.0000 mg | ORAL_TABLET | Freq: Once | ORAL | 0 refills | Status: AC

## 2017-08-19 NOTE — Telephone Encounter (Addendum)
Elizabeth Mercer tested positive for  Chlamydia. Patient was called by RN and allergies and pharmacy confirmed. Rx sent to pharmacy of choice.   [name], [name], PA-C 08/19/2017 8:17 PM      ----- Message from Kathe BectonLori S Berdik, RN sent at 08/19/2017  9:02 AM EST ----- This patient tested positive for :  chlamydia  She ::"has NKDA", I have informed the patient of her results and confirmed her pharmacy is correct in her chart. Please send Rx.   Thank you,   Kathe BectonBerdik, Lori S, RN   [facility]

## 2017-09-07 IMAGING — DX DG HAND COMPLETE 3+V*R*
3 series · 3 of 3 positions shown · non-contrast
Comparison: None.

CLINICAL DATA: RIGHT hand pain and swelling. No injury. First
metacarpal area.

EXAM:
RIGHT HAND - COMPLETE 3+ VIEW

[x hand pa right]
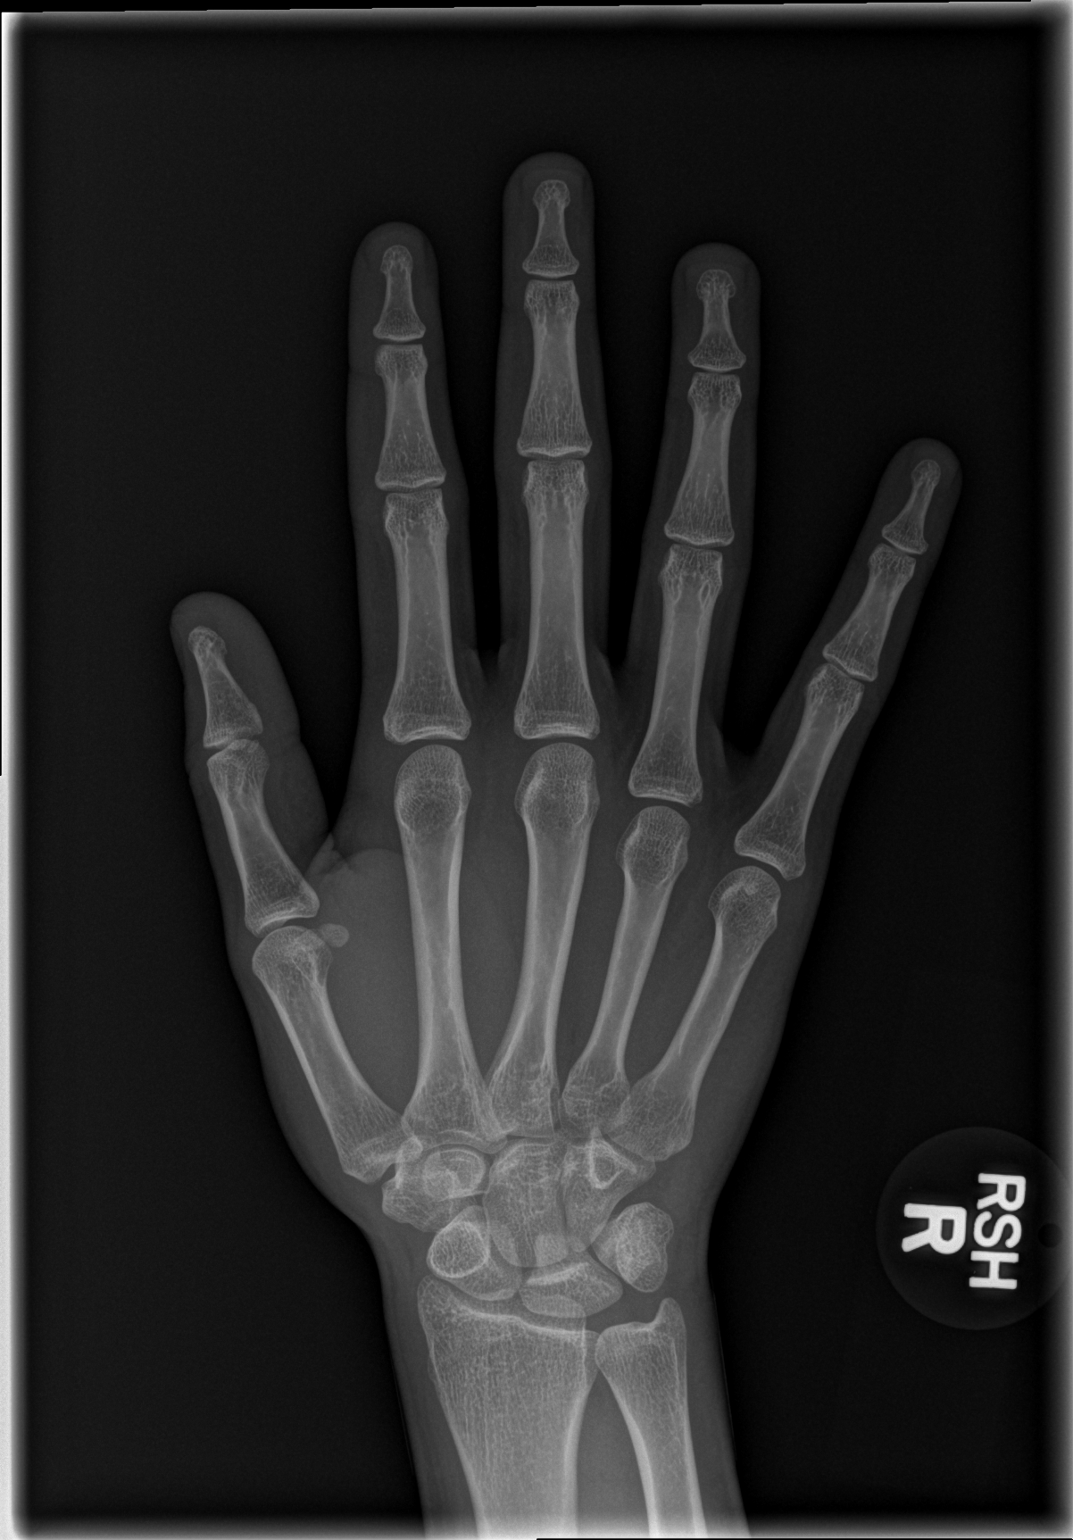

[x hand obl right]
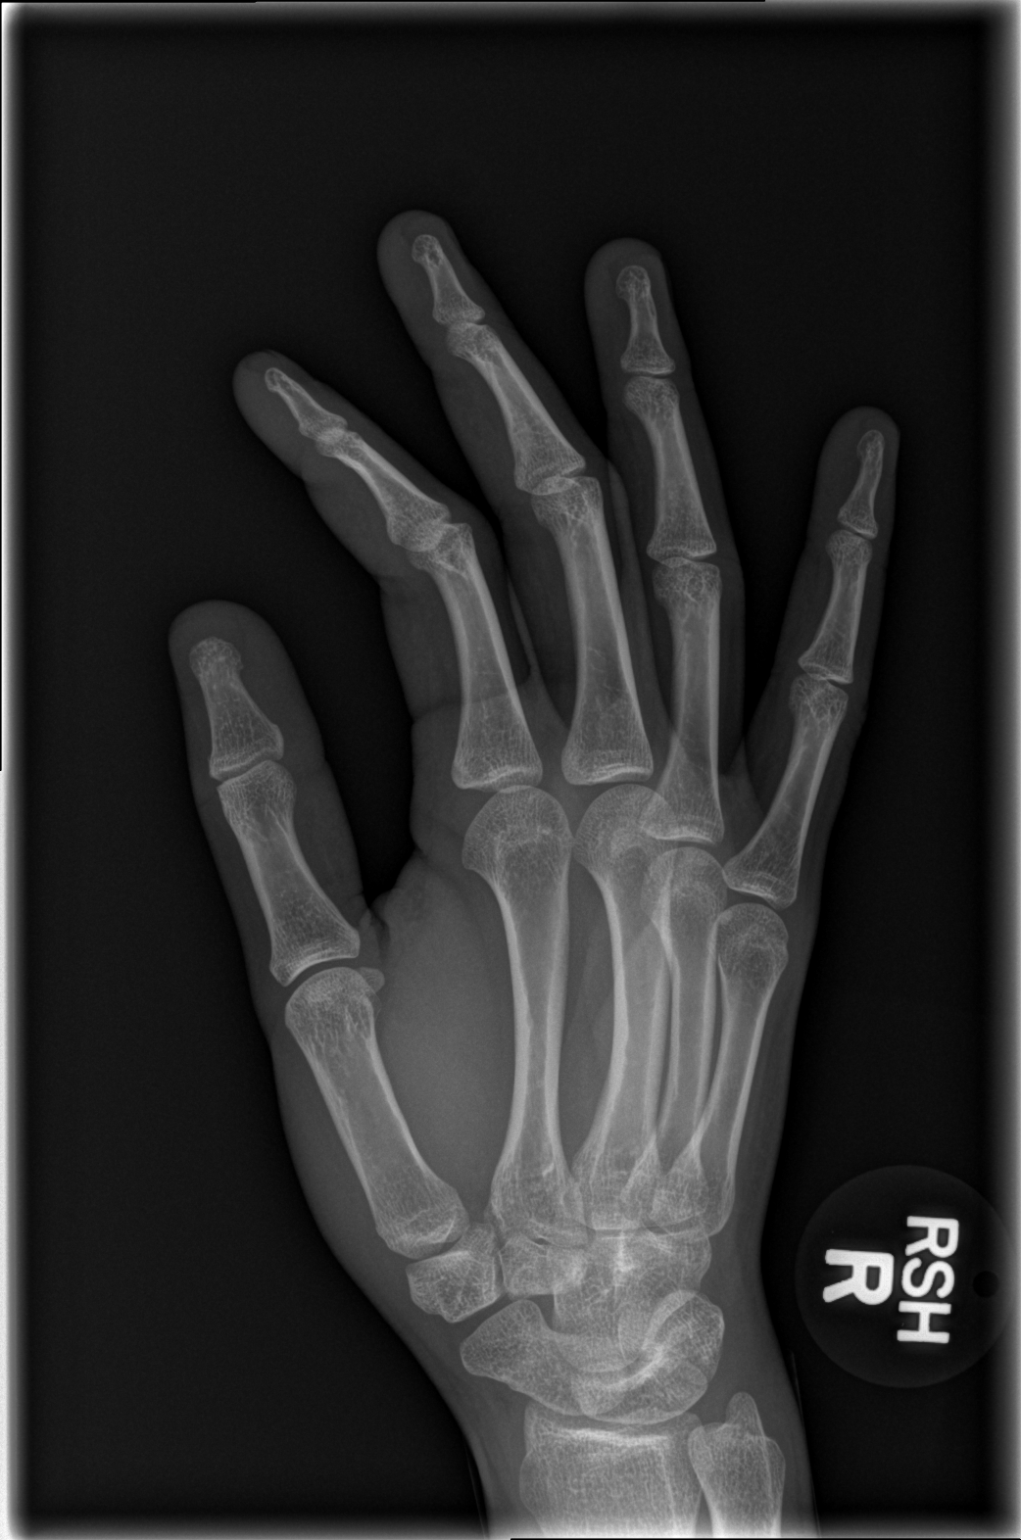

[x hand lat right]
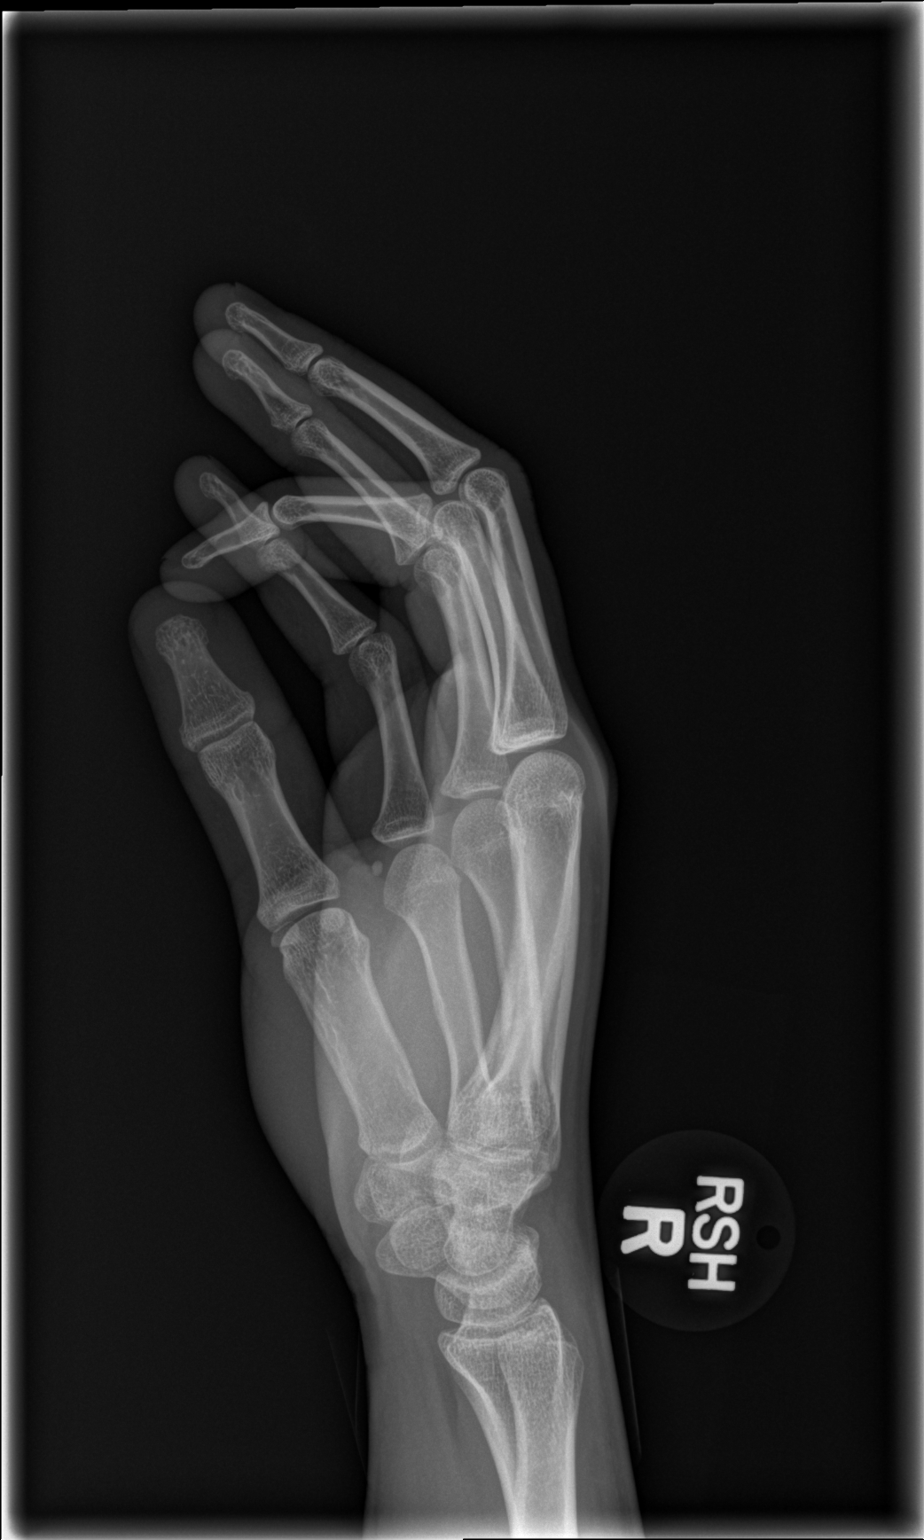

[3 of 3 positions shown; findings below may reference images not displayed]

FINDINGS: There is no evidence of fracture or dislocation. There is no
evidence of arthropathy or other focal bone abnormality. Soft
tissues are unremarkable.
IMPRESSION: Negative.

## 2019-03-29 ENCOUNTER — Encounter (HOSPITAL_COMMUNITY): Payer: Self-pay | Admitting: Emergency Medicine

## 2019-03-29 ENCOUNTER — Ambulatory Visit (HOSPITAL_COMMUNITY)
Admission: EM | Admit: 2019-03-29 | Discharge: 2019-03-29 | Disposition: A | Discharge status: Home / Self Care | Payer: BC Managed Care – PPO | Attending: Nurse Practitioner | Admitting: Nurse Practitioner

## 2019-03-29 ENCOUNTER — Other Ambulatory Visit: Payer: Self-pay

## 2019-03-29 DIAGNOSIS — Z711 Person with feared health complaint in whom no diagnosis is made: Secondary | ICD-10-CM | POA: Diagnosis not present

## 2019-03-29 DIAGNOSIS — N760 Acute vaginitis: Secondary | ICD-10-CM | POA: Diagnosis not present

## 2019-03-29 DIAGNOSIS — Z202 Contact with and (suspected) exposure to infections with a predominantly sexual mode of transmission: Secondary | ICD-10-CM

## 2019-03-29 DIAGNOSIS — Z3202 Encounter for pregnancy test, result negative: Secondary | ICD-10-CM

## 2019-03-29 LAB — POCT PREGNANCY, URINE: Preg Test, Ur: NEGATIVE

## 2019-03-29 LAB — POCT URINALYSIS DIP (DEVICE)
Bilirubin Urine: NEGATIVE
Glucose, UA: NEGATIVE mg/dL
Hgb urine dipstick: NEGATIVE
Ketones, ur: NEGATIVE mg/dL
Nitrite: POSITIVE — AB
Protein, ur: NEGATIVE mg/dL
Specific Gravity, Urine: 1.02 (ref 1.005–1.030)
Urobilinogen, UA: 0.2 mg/dL (ref 0.0–1.0)
pH: 7.5 (ref 5.0–8.0)

## 2019-03-29 MED ORDER — METRONIDAZOLE 500 MG PO TABS: 500.0000 mg | ORAL_TABLET | Freq: Two times a day (BID) | ORAL | 0 refills | Status: DC

## 2019-03-29 MED ORDER — LIDOCAINE HCL (PF) 1 % IJ SOLN
INTRAMUSCULAR | Status: AC
  Filled 2019-03-29: qty 2

## 2019-03-29 MED ORDER — FLUCONAZOLE 150 MG PO TABS
150.0000 mg | ORAL_TABLET | ORAL | 0 refills | Status: AC
Start: 2019-03-29 — End: 2019-04-05

## 2019-03-29 MED ORDER — CEFTRIAXONE SODIUM 250 MG IJ SOLR
INTRAMUSCULAR | Status: AC
  Filled 2019-03-29: qty 250

## 2019-03-29 MED ORDER — NITROFURANTOIN MONOHYD MACRO 100 MG PO CAPS: 100.0000 mg | ORAL_CAPSULE | Freq: Two times a day (BID) | ORAL | 0 refills | Status: DC

## 2019-03-29 MED ORDER — CEFTRIAXONE SODIUM 250 MG IJ SOLR
250.0000 mg | Freq: Once | INTRAMUSCULAR | Status: AC
  Administered 2019-03-29: 250 mg via INTRAMUSCULAR

## 2019-03-29 MED ORDER — AZITHROMYCIN 250 MG PO TABS
ORAL_TABLET | ORAL | Status: AC
  Filled 2019-03-29: qty 4

## 2019-03-29 MED ORDER — AZITHROMYCIN 250 MG PO TABS
1000.0000 mg | ORAL_TABLET | Freq: Once | ORAL | Status: AC
  Administered 2019-03-29: 1000 mg via ORAL

## 2019-03-29 NOTE — ED Provider Notes (Signed)
[place]    CSN: [id number] Arrival date & time: 03/29/19  1025      History   Chief Complaint Chief Complaint  Patient presents with   Appointment    10:30   Vaginal Discharge    HPI Elizabeth Mercer is a 23 y.o. female.   Subjective:   Elizabeth Mercer is a 23 y.o. female who presents for evaluation of vaginal discharge yellow, thick and malodorous. Symptoms have been present for 1 week. Symptoms include burning, lesions, local irritation, odor, post coital bleeding, urinary symptoms of dysuria and lower abdominal pain and vulvar itching.   Menstrual pattern: LMP 01/2018 s/p cessation of Depo  Contraception: none Sexual activity: active with one female partner who she just discovered has been unfaithful   STI Risk: Possible STD exposure   The following portions of the patient's history were reviewed and updated as appropriate: allergies, current medications, past family history, past medical history, past social history, past surgical history and problem list.        History reviewed. No pertinent past medical history.  There are no active problems to display for this patient.   History reviewed. No pertinent surgical history.  OB History   No obstetric history on file.      Home Medications    Prior to Admission medications   Medication Sig Start Date End Date Taking? Authorizing Provider  fluconazole (DIFLUCAN) 150 MG tablet Take 1 tablet (150 mg total) by mouth every 3 (three) days for 3 doses. 03/29/19 04/05/19  Enrique Sack, FNP  metroNIDAZOLE (FLAGYL) 500 MG tablet Take 1 tablet (500 mg total) by mouth 2 (two) times daily. 03/29/19   Enrique Sack, FNP  nitrofurantoin, macrocrystal-monohydrate, (MACROBID) 100 MG capsule Take 1 capsule (100 mg total) by mouth 2 (two) times daily. 03/29/19   Enrique Sack, FNP    Family History Family History  Problem Relation Age of Onset   Hypertension Mother     Social History Social  History   Tobacco Use   Smoking status: Never Smoker   Smokeless tobacco: Never Used  Substance Use Topics   Alcohol use: No   Drug use: Yes    Types: Marijuana     Allergies   Patient has no known allergies.   Review of Systems Review of Systems  Constitutional: Negative for fever.  Gastrointestinal: Negative for abdominal pain, constipation, diarrhea, nausea and vomiting.  Genitourinary: Positive for vaginal discharge. Negative for dysuria and flank pain.  Musculoskeletal: Negative for back pain.  All other systems reviewed and are negative.    Physical Exam Triage Vital Signs ED Triage Vitals  Enc Vitals Group     BP 03/29/19 1050 106/65     Pulse Rate 03/29/19 1050 65     Resp 03/29/19 1050 16     Temp 03/29/19 1050 98.2 F (36.8 C)     Temp Source 03/29/19 1050 Oral     SpO2 03/29/19 1050 100 %     Weight --      Height --      Head Circumference --      Peak Flow --      Pain Score 03/29/19 1047 0     Pain Loc --      Pain Edu? --      Excl. in [place]? --    No data found.  Updated Vital Signs BP 106/65 (BP Location: Left Arm) Comment: small adult cuff   Pulse 65    Temp 98.2 F (  36.8 C) (Oral)    Resp 16    LMP 01/29/2018 Comment: stopped depo shot in july of 2019   SpO2 100%   Visual Acuity Right Eye Distance:   Left Eye Distance:   Bilateral Distance:    Right Eye Near:   Left Eye Near:    Bilateral Near:     Physical Exam   UC Treatments / Results  Labs (all labs ordered are listed, but only abnormal results are displayed) Labs Reviewed  POCT URINALYSIS DIP (DEVICE) - Abnormal; Notable for the following components:      Result Value   Nitrite POSITIVE (*)    Leukocytes,Ua SMALL (*)    All other components within normal limits  POC URINE PREG, ED  POCT PREGNANCY, URINE  CERVICOVAGINAL ANCILLARY ONLY    EKG   Radiology No results found.  Procedures Procedures (including critical care time)  Medications Ordered in  UC Medications  cefTRIAXone (ROCEPHIN) injection 250 mg (has no administration in time range)  azithromycin (ZITHROMAX) tablet 1,000 mg (has no administration in time range)    Initial Impression / Assessment and Plan / UC Course  I have reviewed the triage vital signs and the nursing notes.  Pertinent labs & imaging results that were available during my care of the patient were reviewed by me and considered in my medical decision making (see chart for details).     23 yo female with a one-week history of vaginal discharge. Denies any other symptoms. Concerned for STI.    Plan: Empiric treatment for gonorrhea/chlamydia given  Educational materials distributed. Oral antifungal see orders. Oral antibiotics see orders. Abstinence from intercourse discussed. Partner notification discussed. Discussed safe sex.  Today's evaluation has revealed no signs of a dangerous process. Discussed diagnosis with patient and/or guardian. Patient and/or guardian aware of their diagnosis, possible red flag symptoms to watch out for and need for close follow up. Patient and/or guardian understands verbal and written discharge instructions. Patient and/or guardian comfortable with plan and disposition.  Patient and/or guardian has a clear mental status at this time, good insight into illness (after discussion and teaching) and has clear judgment to make decisions regarding their care  This care was provided during an unprecedented National Emergency due to the Novel Coronavirus (COVID-19) pandemic. COVID-19 infections and transmission risks place heavy strains on healthcare resources.  As this pandemic evolves, our facility, providers, and staff strive to respond fluidly, to remain operational, and to provide care relative to available resources and information. Outcomes are unpredictable and treatments are without well-defined guidelines. Further, the impact of COVID-19 on all aspects of urgent care, including  the impact to patients seeking care for reasons other than COVID-19, is unavoidable during this national emergency. At this time of the global pandemic, management of patients has significantly changed, even for non-COVID positive patients given high local and regional COVID volumes at this time requiring high healthcare system and resource utilization. The standard of care for management of both COVID suspected and non-COVID suspected patients continues to change rapidly at the local, regional, national, and global levels. This patient was worked up and treated to the best available but ever changing evidence and resources available at this current time.   Documentation was completed with the aid of voice recognition software. Transcription may contain typographical errors. Final Clinical Impressions(s) / UC Diagnoses   Final diagnoses:  Acute vaginitis  Concern about STD in female without diagnosis     Discharge Instructions  1. Take medications as prescribed  2. Do not have sex for at least 7 days  3. Do not drink alcohol while on the flagyl  4. I have treated you for possible GC/Chlamydia  5. Check MyChart in a couple of days for results of your tests   It was a pleasure taking care of you today!  Stay Safe [facility]     ED Prescriptions    Medication Sig Dispense Auth. Provider   metroNIDAZOLE (FLAGYL) 500 MG tablet Take 1 tablet (500 mg total) by mouth 2 (two) times daily. 14 tablet Lurline Idol, FNP   fluconazole (DIFLUCAN) 150 MG tablet Take 1 tablet (150 mg total) by mouth every 3 (three) days for 3 doses. 3 tablet Aamir Mclinden, [place], FNP   nitrofurantoin, macrocrystal-monohydrate, (MACROBID) 100 MG capsule Take 1 capsule (100 mg total) by mouth 2 (two) times daily. 10 capsule Lurline Idol, FNP     Controlled Substance Prescriptions [place] Controlled Substance Registry consulted? Not Applicable   Lurline Idol, [place] 03/29/19 1156

## 2019-03-29 NOTE — Discharge Instructions (Addendum)
Take medications as prescribed  Do not have sex for at least 7 days  Do not drink alcohol while on the flagyl  I have treated you for possible GC/Chlamydia  Check MyChart in a couple of days for results of your tests   It was a pleasure taking care of you today!  Stay Safe [place]

## 2019-03-29 NOTE — ED Triage Notes (Signed)
noticed vaginal discharge one week ago No urinary symptoms Denies odor to discharge  No abdominal pain or back pain

## 2019-03-29 NOTE — ED Notes (Signed)
Vaginal swab and urine specimen in lab 

## 2019-03-31 LAB — CERVICOVAGINAL ANCILLARY ONLY
Bacterial vaginitis: POSITIVE — AB
Candida vaginitis: NEGATIVE
Chlamydia: NEGATIVE
Neisseria Gonorrhea: POSITIVE — AB
Trichomonas: NEGATIVE

## 2019-04-02 ENCOUNTER — Telehealth (HOSPITAL_COMMUNITY): Payer: Self-pay | Admitting: Emergency Medicine

## 2019-04-02 NOTE — Telephone Encounter (Signed)
Bacterial Vaginosis test is positive.  Prescription for metronidazole was given at the urgent care visit.  Test for gonorrhea was positive. This was treated at the urgent care visit with IM rocephin 250mg  and po zithromax 1g. Pt needs education to refrain from sexual intercourse for 7 days after treatment to give the medicine time to work. Sexual partners need to be notified and tested/treated. Condoms may reduce risk of reinfection. Recheck or followup with PCP for further evaluation if symptoms are not improving. [facility] notified.   Attempted to reach patient. No answer at this time. Voicemail left.

## 2019-04-02 NOTE — Telephone Encounter (Signed)
Patient contacted and made aware of swab results, all questions answered.   

## 2019-10-23 ENCOUNTER — Encounter (HOSPITAL_COMMUNITY): Payer: Self-pay

## 2019-10-23 ENCOUNTER — Emergency Department (HOSPITAL_COMMUNITY)
Admission: EM | Admit: 2019-10-23 | Discharge: 2019-10-23 | Disposition: A | Discharge status: Home / Self Care | Payer: BC Managed Care – PPO | Attending: Emergency Medicine | Admitting: Emergency Medicine

## 2019-10-23 DIAGNOSIS — Z20822 Contact with and (suspected) exposure to covid-19: Secondary | ICD-10-CM | POA: Insufficient documentation

## 2019-10-23 DIAGNOSIS — J029 Acute pharyngitis, unspecified: Secondary | ICD-10-CM | POA: Insufficient documentation

## 2019-10-23 DIAGNOSIS — R05 Cough: Secondary | ICD-10-CM | POA: Insufficient documentation

## 2019-10-23 DIAGNOSIS — U071 COVID-19: Secondary | ICD-10-CM

## 2019-10-23 LAB — POC SARS CORONAVIRUS 2 AG -  ED: SARS Coronavirus 2 Ag: NEGATIVE

## 2019-10-23 LAB — SARS CORONAVIRUS 2 (TAT 6-24 HRS): SARS Coronavirus 2: NEGATIVE

## 2019-10-23 MED ORDER — ONDANSETRON 4 MG PO TBDP: 4.0000 mg | ORAL_TABLET | Freq: Three times a day (TID) | ORAL | 0 refills | Status: DC | PRN

## 2019-10-23 NOTE — ED Triage Notes (Signed)
Pt states that for the past two days she has been having fevers, body aches, cough, sore throat, loss of taste

## 2019-10-23 NOTE — Discharge Instructions (Addendum)
You most likely have COVID-19. Return for shortness of breath, trouble breathing, unable to keep fluids down for 24 hours, fever greater than 102*F.  We recommend you quarantine for 10 days. If no symptoms and no fever within 24 hours of the 10th day, you may return to your usual routine. Inform your work and close contacts that you are ill.  Questions about how to quarantine: [email or web address]

## 2019-10-23 NOTE — ED Provider Notes (Signed)
[facility] Provider Note   CSN: [id number] Arrival date & time: 10/23/19  0518     History Chief Complaint  Patient presents with  . Sore Throat    Elizabeth [facility] is a 24 y.o. female.  [facility] is a healthy 24 yo young woman who presents with 3 days of sore throat, dry cough, and body aches. She also endorses fevers, chills, runny nose, hoarse voice, dry cough, and nausea. She denies shortness of breath, chest pain, emesis, and diarrhea. Her appetite has decreased due to nausea, but she is able to drink without issue. She has no sick contacts. She works and called out sick today, has let her work and roommate know of her symptoms.      History reviewed. No pertinent past medical history.  There are no problems to display for this patient.   History reviewed. No pertinent surgical history.   OB History   No obstetric history on file.     Family History  Problem Relation Age of Onset  . Hypertension Mother     Social History   Tobacco Use  . Smoking status: Never Smoker  . Smokeless tobacco: Never Used  Substance Use Topics  . Alcohol use: No  . Drug use: Yes    Types: Marijuana    Home Medications Prior to Admission medications   Medication Sig Start Date End Date Taking? Authorizing Provider  metroNIDAZOLE (FLAGYL) 500 MG tablet Take 1 tablet (500 mg total) by mouth 2 (two) times daily. 03/29/19   Enrique Sack, FNP  nitrofurantoin, macrocrystal-monohydrate, (MACROBID) 100 MG capsule Take 1 capsule (100 mg total) by mouth 2 (two) times daily. 03/29/19   Enrique Sack, FNP  ondansetron (ZOFRAN ODT) 4 MG disintegrating tablet Take 1 tablet (4 mg total) by mouth every 8 (eight) hours as needed for nausea or vomiting. 10/23/19   Gladys Damme, MD    Allergies    Patient has no known allergies.  Review of Systems   Review of Systems  Constitutional: Positive for activity change, appetite change, chills,  fatigue and fever. Negative for diaphoresis.  HENT: Positive for congestion and sore throat. Negative for drooling, facial swelling, sinus pressure, sinus pain, sneezing and trouble swallowing.   Respiratory: Positive for cough. Negative for chest tightness, shortness of breath, wheezing and stridor.   Cardiovascular: Negative for chest pain and palpitations.  Gastrointestinal: Positive for nausea. Negative for abdominal distention, abdominal pain, constipation, diarrhea and vomiting.  Musculoskeletal: Positive for myalgias.  Skin: Negative for rash.  Neurological: Positive for headaches. Negative for dizziness.    Physical Exam Updated Vital Signs BP 100/71 (BP Location: Right Arm)   Pulse 65   Temp 98 F (36.7 C) (Temporal)   Resp 12   SpO2 100%   Physical Exam Vitals and nursing note reviewed.  Constitutional:      General: She is not in acute distress.    Appearance: She is well-developed and normal weight. She is ill-appearing. She is not toxic-appearing or diaphoretic.  HENT:     Head: Normocephalic and atraumatic.     Nose: No congestion or rhinorrhea.     Mouth/Throat:     Mouth: Mucous membranes are dry. No oral lesions.     Pharynx: Oropharynx is clear. No pharyngeal swelling, oropharyngeal exudate or posterior oropharyngeal erythema.     Tonsils: No tonsillar exudate or tonsillar abscesses. 0 on the right. 0 on the left.  Eyes:     Conjunctiva/sclera: Conjunctivae normal.  Pupils: Pupils are equal, round, and reactive to light.  Cardiovascular:     Rate and Rhythm: Normal rate and regular rhythm.     Heart sounds: No murmur. No gallop.   Pulmonary:     Effort: Pulmonary effort is normal. No respiratory distress.     Breath sounds: Normal breath sounds. No stridor. No wheezing, rhonchi or rales.  Abdominal:     General: Bowel sounds are normal. There is no distension.     Palpations: Abdomen is soft.     Tenderness: There is no abdominal tenderness.    Musculoskeletal:     Cervical back: Neck supple.  Lymphadenopathy:     Cervical: No cervical adenopathy.  Skin:    General: Skin is warm and dry.     Capillary Refill: Capillary refill takes less than 2 seconds.  Neurological:     General: No focal deficit present.     Mental Status: She is alert and oriented to person, place, and time.  Psychiatric:        Mood and Affect: Mood normal.        Behavior: Behavior normal.    ED Results / Procedures / Treatments   Labs (all labs ordered are listed, but only abnormal results are displayed) Labs Reviewed  SARS CORONAVIRUS 2 (TAT 6-24 HRS)  POC SARS CORONAVIRUS 2 AG -  ED    EKG None  Radiology No results found.  Procedures Procedures (including critical care time)  Medications Ordered in ED Medications - No data to display  ED Course  I have reviewed the triage vital signs and the nursing notes.  Pertinent labs & imaging results that were available during my care of the patient were reviewed by me and considered in my medical decision making (see chart for details).    MDM Rules/Calculators/A&P                     [facility] is a 24 yo woman with symptoms consistent with COVID-19. Pre-test probability high with negative antigen testing. Pending COVID-19 PCR testing. Recommend quarantine for 10 days per CDC guidelines, work note given, with strict return precautions for shortness of breath, trouble breathing, intractable nausea/vomiting and/or inability to tolerate PO. Patient counseled on quarantining, she has already let her close contacts know. Zofran ODT prescription given for nausea. Follow up with PCP if no improvement.   Final Clinical Impression(s) / ED Diagnoses Final diagnoses:  COVID-19    Rx / DC Orders ED Discharge Orders         Ordered    ondansetron (ZOFRAN ODT) 4 MG disintegrating tablet  Every 8 hours PRN     10/23/19 0949           Shirlean Mylar, MD 10/23/19 1027    Blane Ohara,  MD 10/23/19 1521

## 2019-11-24 ENCOUNTER — Emergency Department (HOSPITAL_COMMUNITY)
Admission: EM | Admit: 2019-11-24 | Discharge: 2019-11-24 | Disposition: A | Discharge status: Home / Self Care | Payer: Medicaid Other | Attending: Emergency Medicine | Admitting: Emergency Medicine

## 2019-11-24 ENCOUNTER — Encounter (HOSPITAL_COMMUNITY): Payer: Self-pay

## 2019-11-24 DIAGNOSIS — K120 Recurrent oral aphthae: Secondary | ICD-10-CM | POA: Insufficient documentation

## 2019-11-24 MED ORDER — LIDOCAINE VISCOUS HCL 2 % MT SOLN: 15.0000 mL | OROMUCOSAL | 0 refills | Status: DC | PRN

## 2019-11-24 MED ORDER — BENZOCAINE 20 % MT GEL: 1.0000 "application " | Freq: Four times a day (QID) | OROMUCOSAL | 0 refills | Status: DC | PRN

## 2019-11-24 MED ORDER — LIDOCAINE VISCOUS HCL 2 % MT SOLN
15.0000 mL | Freq: Once | OROMUCOSAL | Status: AC
  Administered 2019-11-24: 15 mL via OROMUCOSAL
  Filled 2019-11-24: qty 15

## 2019-11-24 NOTE — ED Provider Notes (Signed)
[facility] EMERGENCY DEPARTMENT Provider Note   CSN: [id number] Arrival date & time: 11/24/19  2159     History Chief Complaint  Patient presents with  . tongue ulcer    [facility] is a 24 y.o. female with no significant past medical history who presents to the ED due to a tongue ulcer that started this morning around 9 AM.  Patient notes she recently removed a piercing from the tip of her tongue a few days prior and noticed an ulcer on the left side of her tongue early this morning.  Tongue ulcer associated with pain especially with palpation.  Denies fever and chills.  Denies sore throat.  She has taken Tylenol with moderate relief.  History obtained from patient and past medical records. No interpreter used during encounter.       History reviewed. No pertinent past medical history.  There are no problems to display for this patient.   History reviewed. No pertinent surgical history.   OB History   No obstetric history on file.     Family History  Problem Relation Age of Onset  . Hypertension Mother     Social History   Tobacco Use  . Smoking status: Never Smoker  . Smokeless tobacco: Never Used  Substance Use Topics  . Alcohol use: No  . Drug use: Yes    Types: Marijuana    Home Medications Prior to Admission medications   Medication Sig Start Date End Date Taking? Authorizing Provider  benzocaine (HURRICAINE) 20 % GEL Use as directed 1 application in the mouth or throat 4 (four) times daily as needed. 11/24/19   Suzy Bouchard, PA-C  lidocaine (XYLOCAINE) 2 % solution Use as directed 15 mLs in the mouth or throat as needed for mouth pain. 11/24/19   Suzy Bouchard, PA-C  metroNIDAZOLE (FLAGYL) 500 MG tablet Take 1 tablet (500 mg total) by mouth 2 (two) times daily. 03/29/19   Enrique Sack, FNP  nitrofurantoin, macrocrystal-monohydrate, (MACROBID) 100 MG capsule Take 1 capsule (100 mg total) by mouth 2 (two) times daily.  03/29/19   Enrique Sack, FNP  ondansetron (ZOFRAN ODT) 4 MG disintegrating tablet Take 1 tablet (4 mg total) by mouth every 8 (eight) hours as needed for nausea or vomiting. 10/23/19   Gladys Damme, MD    Allergies    Patient has no known allergies.  Review of Systems   Review of Systems  Constitutional: Negative for chills and fever.  HENT: Positive for mouth sores. Negative for sore throat.   All other systems reviewed and are negative.   Physical Exam Updated Vital Signs BP 105/77 (BP Location: Right Arm)   Pulse 62   Temp 98.3 F (36.8 C) (Oral)   Resp 18   SpO2 100%   Physical Exam Vitals and nursing note reviewed.  Constitutional:      General: She is not in acute distress.    Appearance: She is not ill-appearing.  HENT:     Head: Normocephalic.     Nose: Nose normal.     Mouth/Throat:     Pharynx: No oropharyngeal exudate or posterior oropharyngeal erythema.     Comments: Small, 4 mm round yellow ulceration on left side of tongue.  No other ulcerations. Eyes:     Pupils: Pupils are equal, round, and reactive to light.  Cardiovascular:     Rate and Rhythm: Normal rate and regular rhythm.     Pulses: Normal pulses.     Heart  sounds: Normal heart sounds. No murmur. No friction rub. No gallop.   Pulmonary:     Effort: Pulmonary effort is normal.     Breath sounds: Normal breath sounds.  Abdominal:     General: Abdomen is flat. There is no distension.     Palpations: Abdomen is soft.     Tenderness: There is no abdominal tenderness. There is no guarding or rebound.  Musculoskeletal:     Cervical back: Neck supple.     Comments: Able to move all 4 extremities without difficulty.  Skin:    General: Skin is warm and dry.  Neurological:     General: No focal deficit present.     Mental Status: She is alert.     ED Results / Procedures / Treatments   Labs (all labs ordered are listed, but only abnormal results are displayed) Labs Reviewed - No data to  display  EKG None  Radiology No results found.  Procedures Procedures (including critical care time)  Medications Ordered in ED Medications  lidocaine (XYLOCAINE) 2 % viscous mouth solution 15 mL (has no administration in time range)    ED Course  I have reviewed the triage vital signs and the nursing notes.  Pertinent labs & imaging results that were available during my care of the patient were reviewed by me and considered in my medical decision making (see chart for details).    MDM Rules/Calculators/A&P                     24 year old female presents to the ED due to ulceration of tongue consistent with aphthous ulcer.  Vitals all within normal limits.  Patient in no acute distress and non-ill-appearing.  4 mm ulceration to left side of tongue.  Patient given benzocaine gel and viscous lidocaine here in the ED for symptomatic relief.  No concern for emergent etiologies at this time. Will also discharge patient with a prescription for viscous lidocaine and benzocaine gel. Strict ED precautions discussed with patient. Patient states understanding and agrees to plan. Patient discharged home in no acute distress and stable vitals.  Final Clinical Impression(s) / ED Diagnoses Final diagnoses:  Aphthous ulcer    Rx / DC Orders ED Discharge Orders         Ordered    benzocaine (HURRICAINE) 20 % GEL  4 times daily PRN     11/24/19 2308    lidocaine (XYLOCAINE) 2 % solution  As needed     11/24/19 2308           Jesusita Oka 11/24/19 2310    Eber Hong, MD 11/25/19 [phone]

## 2019-11-24 NOTE — Discharge Instructions (Signed)
As discussed, the ulcer will take a few days to heal.  I am sending you home with numbing gel and a numbing solution.  Take as needed for pain.  You may also take over-the-counter ibuprofen or Tylenol.  Please follow-up with PCP if symptoms do not improve within the next week.  Return to the ER for new or worsening symptoms.

## 2019-11-24 NOTE — ED Triage Notes (Signed)
Onset today ulcer on end of tongue on left side.

## 2019-12-10 ENCOUNTER — Other Ambulatory Visit (HOSPITAL_COMMUNITY)
Admission: RE | Admit: 2019-12-10 | Discharge: 2019-12-10 | Disposition: A | Discharge status: Home / Self Care | Payer: BC Managed Care – PPO | Source: Ambulatory Visit | Attending: Family Medicine | Admitting: Family Medicine

## 2019-12-10 ENCOUNTER — Encounter: Payer: Self-pay | Admitting: Family Medicine

## 2019-12-10 ENCOUNTER — Other Ambulatory Visit: Payer: Self-pay

## 2019-12-10 ENCOUNTER — Ambulatory Visit: Payer: BC Managed Care – PPO | Admitting: Family Medicine

## 2019-12-10 VITALS — BP 101/66 | HR 67 | Temp 98.4°F | Resp 16 | Ht 62.0 in | Wt 97.2 lb

## 2019-12-10 DIAGNOSIS — Z3009 Encounter for other general counseling and advice on contraception: Secondary | ICD-10-CM

## 2019-12-10 DIAGNOSIS — Z113 Encounter for screening for infections with a predominantly sexual mode of transmission: Secondary | ICD-10-CM

## 2019-12-10 DIAGNOSIS — N912 Amenorrhea, unspecified: Secondary | ICD-10-CM | POA: Insufficient documentation

## 2019-12-10 DIAGNOSIS — Z7689 Persons encountering health services in other specified circumstances: Secondary | ICD-10-CM

## 2019-12-10 DIAGNOSIS — Z01419 Encounter for gynecological examination (general) (routine) without abnormal findings: Secondary | ICD-10-CM | POA: Insufficient documentation

## 2019-12-10 LAB — TSH: TSH: 0.71 u[IU]/mL (ref 0.35–4.50)

## 2019-12-10 LAB — POCT URINE PREGNANCY: Preg Test, Ur: NEGATIVE

## 2019-12-10 MED ORDER — MEDROXYPROGESTERONE ACETATE 10 MG PO TABS: 10.0000 mg | ORAL_TABLET | Freq: Every day | ORAL | 0 refills | Status: DC

## 2019-12-10 NOTE — Patient Instructions (Signed)
I will call in the hormone and it will be available tomorrow at your pharmacy.   You will take it once a day for 8 days and then you should have your cycle a few days to a week after. If you do not please call in after 1 week.   Once you have your cycle we can consider restarting your injections for birth control.

## 2019-12-10 NOTE — Progress Notes (Signed)
[facility]     Patient ID: [name], female  DOB: 1996-01-24, 24 y.o.   MRN: [id number] Patient Care Team    Relationship Specialty Notifications Start End  Ma Hillock, DO PCP - General Family Medicine  12/10/19     Chief Complaint  Patient presents with  . Establish Care    Pt would like to discuss birth control and not having a period in several months. Last Pap would have been done at the hospital, ED visit     Subjective: [facility] is a 24 y.o.  female present for new patient establishment. All past medical history, surgical history, allergies, family history, immunizations, medications and social history were updated in the electronic medical record today. All recent labs, ED visits and hospitalizations within the last year were reviewed.  Well women exam/BC counseling: Patient presents today for new patient establishment desiring birth control counseling.  She reports her last menstrual cycle was 2 to 3 years ago.  Patient has had normal menstrual cycle leading up to the start of Depo-Provera in which she had infrequent cycles.  She discontinued the Depo-Provera 2 to 3 years ago and has never restarted her menstrual cycle.  Patient has not had a Pap smear in the past.  She is sexually active.  Infrequent condom use.  Her electronic medical record review she has had a positive STD test for gonorrhea September 2020.  Patient reports she is concerned about her fertility since she has not had a.  In so long.  She denies any fever, chills, nausea, vomit, fatigue or breast tenderness.  No flowsheet data found. No flowsheet data found.     No flowsheet data found.   Immunization History  Administered Date(s) Administered  . Tdap 12/13/2017    No exam data present  No past medical history on file. No Known Allergies Past Surgical History:  Procedure Laterality Date  . WISDOM TOOTH EXTRACTION     Family History  Problem Relation Age of Onset  . Hypertension Mother     . Arthritis Mother   . Bipolar disorder Mother   . Asthma Mother   . Alcohol abuse Mother   . Hyperlipidemia Mother   . Mental illness Mother   . Diabetes Maternal Grandmother   . Hypertension Maternal Grandmother   . Arthritis Maternal Grandmother   . Asthma Maternal Grandmother   . Hyperlipidemia Maternal Grandmother   . Kidney failure Maternal Grandfather   . Alcohol abuse Maternal Grandfather   . Arthritis Maternal Grandfather   . Depression Maternal Grandfather   . Diabetes Maternal Grandfather   . Heart disease Maternal Grandfather   . Heart attack Maternal Grandfather    Social History   Social History Narrative   Marital status/children/pets: Single.    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    Safety:      -smoke alarm in the home:Yes     - wears seatbelt: Yes     - Feels safe in their relationships: Yes     All past medical history, surgical history, allergies, family history, immunizations andmedications were updated in the EMR today and reviewed under the history and medication portions of their EMR.     No results found.   ROS: 14 pt review of systems performed and negative (unless mentioned in an HPI)  Objective: BP 101/66 (BP Location: Left Arm, Patient Position: Sitting, Cuff Size: Normal)   Pulse 67   Temp 98.4 F (36.9 C) (Temporal)   Resp 16  Ht 5\' 2"  (1.575 m)   Wt 97 lb 4 oz (44.1 kg)   LMP  (Within Months) Comment: unsure but has spotted   SpO2 100%   BMI 17.79 kg/m  Gen: Afebrile. No acute distress. Nontoxic in appearance, well-developed, well-nourished,  Pleasant female.  HENT: AT. [place]. Eyes:Pupils Equal Round Reactive to light, Extraocular movements intact,  Conjunctiva without redness, discharge or icterus. Neck/lymp/endocrine: Supple,no lymphadenopathy, no thyromegaly CV: RRR  Chest: CTAB, no wheeze, rhonchi or crackles.  Abd: Soft. flat. NTND. BS hyperactive. no Masses palpated. No hepatosplenomegaly. No rebound  tenderness or guarding.t. Neuro/Msk:  Normal gait. PERLA. EOMi. Alert. Oriented x3. Psych: Normal affect, dress and demeanor. Normal speech. Normal thought content and judgment. GYN:  External genitalia within normal limits, normal hair distribution, no lesions. Urethral meatus normal, no lesions. Vaginal mucosa pink, moist, normal rugae, no lesions. No cystocele or rectocele. cervix with small polyp versus nabothian cyst 1 o'clock position of cervical os.  Thick white/greenish discharge present. Bimanual exam revealed normal uterus.  No bladder/suprapubic fullness, masses or tenderness. No cervical motion tenderness. No adnexal fullness. Anus and perineum within normal limits, no lesions.  Assessment/plan: [name] is a 24 y.o. female present for est care Amenorrhea/Birth control counseling Female patient with normal menstrual cycles leading up to start of Depo-Provera and then discontinuation of Depo-Provera.  Lengthy discussion today surrounding fertility, STD testing, birth control options and amenorrhea.  She understands if requiring more work-up surrounding fertility she would need to be referred to OB/GYN.  She would like to wait on this for now.   Discussed progesterone challenge with her today. - POCT urine pregnancy> negative today - TSH - provera 10 mg qd x 10 days. Withdrawal bleed should occur within 7 days. She will call in to let 30 know if she had a bleed. - Consider [place] vs GYN referral for eval of uterine lining with lack of menses.  - she would eventually like to restart depo provera injections.   Encounter for gynecological examination with Papanicolaou smear of cervix/Screen for STD (sexually transmitted disease) - Cytology - PAP( [place])- HPV reflex and g/c/t - HIV antibody (with reflex)   Follow-up dependent upon laboratory results and patient's response to progesterone challenge.  Orders Placed This Encounter  Procedures  . TSH  . HIV antibody (with reflex)    . POCT urine pregnancy   Meds ordered this encounter  Medications  . medroxyPROGESTERone (PROVERA) 10 MG tablet    Sig: Take 1 tablet (10 mg total) by mouth daily.    Dispense:  10 tablet    Refill:  0   Referral Orders  No referral(s) requested today    > 45 Minutes was dedicated to this patient's encounter to include pre-visit review of chart, face-to-face time with patient and post-visit work- which include documentation and prescribing medications and/or ordering test when necessary.    Note is dictated utilizing voice recognition software. Although note has been proof read prior to signing, occasional typographical errors still can be missed. If any questions arise, please do not hesitate to call for verification.  Electronically signed by: [place], DO [facility]

## 2019-12-11 ENCOUNTER — Telehealth: Payer: Self-pay | Admitting: Family Medicine

## 2019-12-11 LAB — CYTOLOGY - PAP
Chlamydia: NEGATIVE
Comment: NEGATIVE
Comment: NEGATIVE
Comment: NORMAL
Diagnosis: NEGATIVE
Neisseria Gonorrhea: NEGATIVE
Trichomonas: POSITIVE — AB

## 2019-12-11 LAB — HIV ANTIBODY (ROUTINE TESTING W REFLEX): HIV 1&2 Ab, 4th Generation: NONREACTIVE

## 2019-12-11 MED ORDER — METRONIDAZOLE 500 MG PO TABS: 500.0000 mg | ORAL_TABLET | Freq: Three times a day (TID) | ORAL | 0 refills | Status: DC

## 2019-12-11 MED ORDER — FLUCONAZOLE 150 MG PO TABS: 150.0000 mg | ORAL_TABLET | Freq: Once | ORAL | 0 refills | Status: AC

## 2019-12-11 NOTE — Telephone Encounter (Signed)
Please inform patient: Her thyroid is functioning normal, therefore not the cause of her lack of menstrual cycle. Take the Provera once a day x10 days as prescribed and she should have a menstrual cycle within a few days to a week after the 10-day course.  She was instructed to call us when she starts her menstrual cycle and/or if she does not start her menstrual cycle after a week off the medication.  She will be instructed at that time on follow-up, Depo-Provera or further evaluation needed.  -Her cervical cancer screening was normal and had no atypical cells. Her STD screening was negative for gonorrhea, chlamydia and HIV.  -Her labs did show evidence of yeast infection> this is not a sexually transmitted disease.  I have called in a medication called Diflucan it is a one-time pill that will treat effectively.  Her labs also showed evidence of trichomonas infection.  This is a sexually transmitted disease.  It is treated with a medication called Flagyl every 8 hours for 7 days.  She will also need to make sure her partner is evaluated by a doctor for testing and treatment-if her partner does not have a PCP they can be treated at the health department or urgent care.  Avoidance of  sexual intercourse until both parties are fully treated.

## 2019-12-12 ENCOUNTER — Encounter: Payer: Self-pay | Admitting: Family Medicine

## 2019-12-12 NOTE — Telephone Encounter (Signed)
Pt was called and given all information/results, she verbalized understanding  

## 2020-01-02 ENCOUNTER — Other Ambulatory Visit: Payer: Self-pay

## 2020-01-02 ENCOUNTER — Ambulatory Visit: Payer: BC Managed Care – PPO | Admitting: Family Medicine

## 2020-01-02 ENCOUNTER — Encounter: Payer: Self-pay | Admitting: Family Medicine

## 2020-01-02 VITALS — BP 96/64 | HR 69 | Temp 98.2°F | Resp 18 | Ht 62.0 in | Wt 97.1 lb

## 2020-01-02 DIAGNOSIS — R1900 Intra-abdominal and pelvic swelling, mass and lump, unspecified site: Secondary | ICD-10-CM

## 2020-01-02 DIAGNOSIS — N912 Amenorrhea, unspecified: Secondary | ICD-10-CM

## 2020-01-02 NOTE — Progress Notes (Signed)
Elizabeth Mercer     Patient ID: Elizabeth Mercer, female  DOB: Nov 27, 1995, 24 y.o.   MRN: [id number] Patient Care Team    Relationship Specialty Notifications Start End  Ma Hillock, DO PCP - General Family Medicine  12/10/19     Chief Complaint  Patient presents with  . Amenorrhea    Pt has not had menstural period even with medication     Subjective: Elizabeth Mercer is a 24 y.o.  female present for  Amenorrhea:  Pt reports she did take the provera for 10 days. She had menstrual cramping about 4 days after completing provera. Cramps last a few days. She did not have any menstrual bleeding.  Prior note:  Patient presents today for new patient establishment desiring birth control counseling.  She reports her last menstrual cycle was 2 to 3 years ago.  Patient has had normal menstrual cycle leading up to the start of Depo-Provera in which she had infrequent cycles.  She discontinued the Depo-Provera 2 to 3 years ago and has never restarted her menstrual cycle.  Patient has not had a Pap smear in the past.  She is sexually active.  Infrequent condom use.  Her electronic medical record review she has had a positive STD test for gonorrhea September 2020.  Patient reports she is concerned about her fertility since she has not had a.  In so long.  She denies any fever, chills, nausea, vomit, fatigue or breast tenderness.  No flowsheet data found. No flowsheet data found.     No flowsheet data found.   Immunization History  Administered Date(s) Administered  . Tdap 12/13/2017    No exam data present  No past medical history on file. No Known Allergies Past Surgical History:  Procedure Laterality Date  . WISDOM TOOTH EXTRACTION     Family History  Problem Relation Age of Onset  . Hypertension Mother   . Arthritis Mother   . Bipolar disorder Mother   . Asthma Mother   . Alcohol abuse Mother   . Hyperlipidemia Mother   . Mental illness Mother   . Diabetes Maternal Grandmother   .  Hypertension Maternal Grandmother   . Arthritis Maternal Grandmother   . Asthma Maternal Grandmother   . Hyperlipidemia Maternal Grandmother   . Kidney failure Maternal Grandfather   . Alcohol abuse Maternal Grandfather   . Arthritis Maternal Grandfather   . Depression Maternal Grandfather   . Diabetes Maternal Grandfather   . Heart disease Maternal Grandfather   . Heart attack Maternal Grandfather    Social History   Social History Narrative   Marital status/children/pets: Single.    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    Safety:      -smoke alarm in the home:Yes     - wears seatbelt: Yes     - Feels safe in their relationships: Yes     All past medical history, surgical history, allergies, family history, immunizations andmedications were updated in the EMR today and reviewed under the history and medication portions of their EMR.     No results found.   ROS: 14 pt review of systems performed and negative (unless mentioned in an HPI)  Objective: BP 96/64 (BP Location: Left Arm, Patient Position: Sitting, Cuff Size: Normal)   Pulse 69   Temp 98.2 F (36.8 C) (Temporal)   Resp 18   Ht 5\' 2"  (1.575 m)   Wt 97 lb 2 oz (44.1 kg)   SpO2 100%   BMI  17.76 kg/m  Gen: Afebrile. No acute distress. Pleasant female. Thins Chest: CTAB, no wheeze or crackles Abd: Soft. Thin, NTND. BS present. Small left suprapubicpelvic Mass palpated- ? Poss cyst or fibroid.   Neuro:Normal gait. PERLA. EOMi. Alert. Oriented x3 Psych: Normal affect, dress and demeanor. Normal speech. Normal thought content and judgment.   Assessment/plan: Elizabeth Mercer is a 24 y.o. female present for est care Amenorrhea/small pelvic mass  Female patient with normal menstrual cycles leading up to start of Depo-Provera and then amenorrhea since her  discontinuation of Depo-Provera 2-3 years ago.  Failed 10 d progesterone challenge. - POCT urine pregnancy> negative  - TSH> WNL Referred to  GYN today and  [place] ordered.     Orders Placed This Encounter  Procedures  . US Pelvic Complete With Transvaginal  . Ambulatory referral to Gynecology   No orders of the defined types were placed in this encounter.   Referral Orders     Ambulatory referral to Gynecology   Note is dictated utilizing voice recognition software. Although note has been proof read prior to signing, occasional typographical errors still can be missed. If any questions arise, please do not hesitate to call for verification.  Electronically signed by: Felix Pacini, DO [facility] [place]

## 2020-01-02 NOTE — Patient Instructions (Signed)
I have ordered an Ultrasound and they will call you to schedule.  I have also referred you to a gynecologist to further evaluate and treat your condition  Secondary Amenorrhea  Secondary amenorrhea occurs when a female who was previously having menstrual periods has not had them for 3-6 months. A menstrual period is the monthly shedding of the lining of the uterus. Menstruation involves the passing of blood, tissue, fluid, and mucus through the vagina. The flow of blood usually occurs during 3-7 consecutive days each month. This condition has many causes. In many cases, treating the underlying cause will return menstrual periods back to a normal cycle. What are the causes? The most common cause of this condition is pregnancy. Other causes include:  Malnutrition.  Cirrhosis of the liver.  Conditions of the blood.  Diabetes.  Epilepsy.  Chronic kidney disease.  Polycystic ovary disease.  Stress or anxiety.  A hormonal imbalance.  Ovarian failure.  Medicines.  Extreme obesity.  Cystic fibrosis.  Low body weight or drastic weight loss.  Early menopause.  Removal of the ovaries or uterus.  Contraceptive pills, patches, or vaginal rings.  Cushing syndrome.  Thyroid problems. What increases the risk? You are more likely to develop this condition if:  You have a family history of this condition.  You have an eating disorder.  You do extreme athletic training.  You have a chronic disease.  You abuse substances such as alcohol or cigarettes. What are the signs or symptoms? The main symptom of this condition is a lack of menstrual periods for 3-6 months. How is this diagnosed? This condition may be diagnosed based on:  Your medical history.  A physical exam.  A pelvic exam to check for problems with your reproductive organs.  A procedure to examine the uterus.  A measurement of your body mass index (BMI).  Tests, such as: ? Blood tests that measure  certain hormones in your body and rule out pregnancy. ? Urine tests. ? Imaging tests, such as an ultrasound, CT scan, or MRI. How is this treated? Treatment for this condition depends on the cause of the amenorrhea. It may involve:  Correcting dietary problems.  Treating underlying conditions.  Medicines.  Lifestyle changes.  Surgery. If the condition cannot be corrected, it is sometimes possible to trigger menstrual periods with medicines. Follow these instructions at home: Lifestyle  Maintain a healthy diet. Ask to meet with a registered dietitian for nutrition counseling and meal planning.  Maintain a healthy weight. Talk to your health care provider before trying any new diet or exercise plan.  Exercise at least 30 minutes 5 or more days each week. Exercising includes brisk walking, yard work, biking, running, swimming, and team sports like basketball and soccer. Ask your health care provider which exercises are safe for you.  Get enough sleep. Plan your sleep time to allow for 7-9 hours of sleep each night.  Learn to manage stress. Explore relaxation techniques such as meditation, journaling, yoga, or tai chi. General instructions  Be aware of changes in your menstrual cycle. Keep a record of when you have your menstrual period. Note the date your period starts, how long it lasts, and any problems you experience.  Take over-the-counter and prescription medicines only as told by your health care provider.  Keep all follow-up visits as told by your health care provider. This is important. Contact a health care provider if:  Your periods do not return to normal after treatment. Summary  Secondary amenorrhea is  when a female who was previously having menstrual periods has not gotten her period for 3-6 months.  This condition has many causes. In many cases, treating the underlying cause will return menstrual periods back to a normal cycle.  Talk to your health care  provider if your periods do not return to normal after treatment. This information is not intended to replace advice given to you by your health care provider. Make sure you discuss any questions you have with your health care provider. Document Revised: 12/19/2018 Document Reviewed: 09/23/2016 Elsevier Patient Education  [place].

## 2020-01-09 ENCOUNTER — Ambulatory Visit: Payer: BC Managed Care – PPO | Admitting: Obstetrics and Gynecology

## 2020-01-09 ENCOUNTER — Other Ambulatory Visit: Payer: Self-pay

## 2020-01-09 ENCOUNTER — Encounter: Payer: Self-pay | Admitting: Obstetrics and Gynecology

## 2020-01-09 VITALS — BP 100/63 | HR 69 | Temp 98.1°F | Ht 61.0 in | Wt 96.0 lb

## 2020-01-09 DIAGNOSIS — Z8619 Personal history of other infectious and parasitic diseases: Secondary | ICD-10-CM | POA: Insufficient documentation

## 2020-01-09 DIAGNOSIS — Z113 Encounter for screening for infections with a predominantly sexual mode of transmission: Secondary | ICD-10-CM | POA: Diagnosis not present

## 2020-01-09 DIAGNOSIS — N911 Secondary amenorrhea: Secondary | ICD-10-CM

## 2020-01-09 DIAGNOSIS — N898 Other specified noninflammatory disorders of vagina: Secondary | ICD-10-CM

## 2020-01-09 NOTE — Progress Notes (Signed)
24 y.o. G0P0000 Single Black or African American Not Hispanic or Latino female here for a consultation for secondary amenorrhea. Patient states that the last time she had a menstrual period was in 2019.     Menarche age 4, they were monthly for a while. Initially would bleed for a week at a time. After ~1+ years she would bleed monthly x 2 weeks She got on OCP's at the end of 10th grade, was on OCP's for 2 months. She then started getting the depo-provera shot. No regular cycles on the depo-provera. She was off of and on the depo-provera. Restarted it in 2019, had a cycle prior to restarting it. Last Depo shot was in July of 2019.  No thyroid c/o. No weight changes. Not trying to loose weight. No galactorrhea. No headaches, no visual changes. No hot flashes or night sweats, no vaginal dryness.  Sexually active, same partner x 3 years, sometimes uses condoms.   She started provera on 5/26 x 10 days and no bleeding after stopping it. Negative UPT on 12/10/19. Normal TSH in 5/21.      No LMP recorded. (Menstrual status: Other).          Sexually active: Yes.    The current method of family planning is condoms sometimes.    Exercising: No.  The patient does not participate in regular exercise at present. Smoker:  no  Health Maintenance: Pap:  12/10/19 normal  History of abnormal Pap:  yes TDaP:  12/13/17 Gardasil: no   reports that she has never smoked. She has never used smokeless tobacco. She reports current drug use. Drug: Marijuana. She reports that she does not drink alcohol. Works at [employer] and [employer].   Past Medical History:  Diagnosis Date  . Amenorrhea     Past Surgical History:  Procedure Laterality Date  . WISDOM TOOTH EXTRACTION      No current outpatient medications on file.   No current facility-administered medications for this visit.    Family History  Problem Relation Age of Onset  . Hypertension Mother   . Arthritis Mother   . Bipolar disorder Mother   .  Asthma Mother   . Alcohol abuse Mother   . Hyperlipidemia Mother   . Mental illness Mother   . Diabetes Maternal Grandmother   . Hypertension Maternal Grandmother   . Arthritis Maternal Grandmother   . Asthma Maternal Grandmother   . Hyperlipidemia Maternal Grandmother   . Kidney failure Maternal Grandfather   . Alcohol abuse Maternal Grandfather   . Arthritis Maternal Grandfather   . Depression Maternal Grandfather   . Diabetes Maternal Grandfather   . Heart disease Maternal Grandfather   . Heart attack Maternal Grandfather     Review of Systems  All other systems reviewed and are negative.   Exam:   BP 100/63   Pulse 69   Temp 98.1 F (36.7 C)   Ht 5\' 1"  (1.549 m)   Wt 96 lb (43.5 kg)   SpO2 100%   BMI 18.14 kg/m   Weight change: @WEIGHTCHANGE @ Height:   Height: 5\' 1"  (154.9 cm)  Ht Readings from Last 3 Encounters:  01/09/20 5\' 1"  (1.549 m)  01/02/20 5\' 2"  (1.575 m)  12/10/19 5\' 2"  (1.575 m)    General appearance: alert, cooperative and appears stated age Head: Normocephalic, without obvious abnormality, atraumatic Neck: no adenopathy, supple, symmetrical, trachea midline and thyroid normal to inspection and palpation Abdomen: soft, non-tender; non distended,  no masses,  no organomegaly  Extremities: extremities normal, atraumatic, no cyanosis or edema Skin: Skin color, texture, turgor normal. No rashes or lesions Lymph nodes: Cervical, supraclavicular, and axillary nodes normal. No abnormal inguinal nodes palpated Neurologic: Grossly normal   Pelvic: External genitalia:  no lesions              Urethra:  normal appearing urethra with no masses, tenderness or lesions              Bartholins and Skenes: normal                 Vagina: normal appearing vagina with normal color, slight increase in white, creamy vaginal d/c              Cervix: no lesions               Bimanual Exam:  Uterus:  normal size, contour, position, consistency, mobility, non-tender               Adnexa: no mass, fullness, tenderness               Rectovaginal: Confirms               Anus:  normal sphincter tone, no lesions  Gae Dry chaperoned for the exam.  A:  Secondary amenorrhea 2 years after stopping depo-provera. No W/D bleed to provera. Vagina appears estrogenized. BMI is low normal (no change)  Trich last month, treated. Prior h/o GC/CT  P:   Recent normal TSH  BhcG, FSH, estradiol, Prolactin  Full STD testing (patient desires)  Recommended she use condoms, she could randomly ovulate.  Further plans after her labs return.   CC: Dr Elizabeth Mercer Note sent

## 2020-01-09 NOTE — Patient Instructions (Signed)

## 2020-01-10 LAB — HEP, RPR, HIV PANEL
HIV Screen 4th Generation wRfx: NONREACTIVE
Hepatitis B Surface Ag: NEGATIVE
RPR Ser Ql: NONREACTIVE

## 2020-01-10 LAB — HEPATITIS C ANTIBODY: Hep C Virus Ab: <0.1 s/co ratio (ref 0.0–0.9)

## 2020-01-10 LAB — ESTRADIOL: Estradiol: 38.6 pg/mL

## 2020-01-10 LAB — FOLLICLE STIMULATING HORMONE: FSH: 5.8 m[IU]/mL

## 2020-01-10 LAB — PROLACTIN: Prolactin: 2.8 ng/mL — ABNORMAL LOW (ref 4.8–23.3)

## 2020-01-10 LAB — HCG, SERUM, QUALITATIVE: hCG,Beta Subunit,Qual,Serum: NEGATIVE m[IU]/mL (ref <6)

## 2020-01-13 LAB — NUSWAB VAGINITIS PLUS (VG+)
Candida albicans, NAA: NEGATIVE
Candida glabrata, NAA: NEGATIVE
Chlamydia trachomatis, NAA: POSITIVE — AB
Megasphaera 1: HIGH Score — AB
Neisseria gonorrhoeae, NAA: NEGATIVE
Trich vag by NAA: NEGATIVE

## 2020-01-14 ENCOUNTER — Other Ambulatory Visit: Payer: Self-pay | Admitting: *Deleted

## 2020-01-14 DIAGNOSIS — N912 Amenorrhea, unspecified: Secondary | ICD-10-CM

## 2020-01-14 DIAGNOSIS — R7989 Other specified abnormal findings of blood chemistry: Secondary | ICD-10-CM

## 2020-01-14 MED ORDER — AZITHROMYCIN 250 MG PO TABS: 1000.0000 mg | ORAL_TABLET | Freq: Once | ORAL | 0 refills | Status: AC

## 2020-01-16 ENCOUNTER — Ambulatory Visit
Admission: RE | Admit: 2020-01-16 | Discharge: 2020-01-16 | Disposition: A | Discharge status: Home / Self Care | Payer: BC Managed Care – PPO | Source: Ambulatory Visit | Attending: Family Medicine | Admitting: Family Medicine

## 2020-01-16 DIAGNOSIS — N912 Amenorrhea, unspecified: Secondary | ICD-10-CM

## 2020-01-16 DIAGNOSIS — R1900 Intra-abdominal and pelvic swelling, mass and lump, unspecified site: Secondary | ICD-10-CM

## 2020-01-30 ENCOUNTER — Other Ambulatory Visit: Payer: Self-pay

## 2020-01-30 ENCOUNTER — Ambulatory Visit
Admission: RE | Admit: 2020-01-30 | Discharge: 2020-01-30 | Disposition: A | Discharge status: Home / Self Care | Payer: Medicaid Other | Source: Ambulatory Visit | Attending: Obstetrics and Gynecology | Admitting: Obstetrics and Gynecology

## 2020-01-30 DIAGNOSIS — N912 Amenorrhea, unspecified: Secondary | ICD-10-CM

## 2020-01-30 DIAGNOSIS — R7989 Other specified abnormal findings of blood chemistry: Secondary | ICD-10-CM

## 2020-01-30 MED ORDER — GADOBENATE DIMEGLUMINE 529 MG/ML IV SOLN
10.0000 mL | Freq: Once | INTRAVENOUS | Status: AC | PRN
  Administered 2020-01-30: 10 mL via INTRAVENOUS

## 2020-02-01 ENCOUNTER — Telehealth: Payer: Self-pay | Admitting: *Deleted

## 2020-02-01 DIAGNOSIS — R7989 Other specified abnormal findings of blood chemistry: Secondary | ICD-10-CM

## 2020-02-01 DIAGNOSIS — N912 Amenorrhea, unspecified: Secondary | ICD-10-CM

## 2020-02-01 NOTE — Telephone Encounter (Signed)
Patient is returning call.  °

## 2020-02-01 NOTE — Telephone Encounter (Signed)
Leda Min, RN  02/01/2020 12:47 PM EDT Back to Top    Left message to call Noreene Larsson, RN at [facility] [phone].   Removed from IMG hold.

## 2020-02-01 NOTE — Telephone Encounter (Signed)
Spoke with patient, advised per Dr. Oscar La. Patient agreeable to proceed with referral to endocrinology. Reviewed options, referral placed to [facility] endocrinology. Advised patient their office will contact her directly to schedule. Patient verbalizes understanding and is agreeable.   Routing to [employer].   Encounter closed.

## 2020-02-01 NOTE — Telephone Encounter (Signed)
-----   Message from Romualdo Bolk, MD sent at 01/31/2020  2:27 PM EDT ----- Please let the patient know that her MRI is normal. Please set her up for an Endocrinology consultation for hypothalamic amenorrhea, low prolactin. 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

## 2020-03-05 DIAGNOSIS — Z20828 Contact with and (suspected) exposure to other viral communicable diseases: Secondary | ICD-10-CM | POA: Diagnosis not present

## 2020-04-04 ENCOUNTER — Ambulatory Visit: Payer: Medicaid Other | Admitting: Internal Medicine

## 2020-04-04 NOTE — Progress Notes (Deleted)
Patient ID: Kamaile Zachow, female   DOB: [birth year], 24 y.o.   MRN: [id number]   This visit occurred during the SARS-CoV-2 public health emergency.  Safety protocols were in place, including screening questions prior to the visit, additional usage of staff PPE, and extensive cleaning of exam room while observing appropriate contact time as indicated for disinfecting solutions.   HPI  Marisel Piscopo is a 24 y.o.-year-old female, referred by her OB/GYN doctor, Dr. Oscar La, for evaluation for secondary amenorrhea with low prolactin.  Pt describes that she had menarche at 34 and initially her menstrual cycles would last 1 week every month then 2 weeks/month.  She then started OCPs at 24 years old and she continued this for 2 months.  Afterwards, she started on Depo-Provera injections.  She did not have regular withdrawal bleeds at that time.  She was off and on Depo-Provera with the last injection in 01/2018.  She had 1 menstrual cycle before stopping the injection.  No menstrual cycle since then.  She was started on Provera p.o. for 10 days -she did not bleed after taking this in 11/2019.  Of note, she has the same partner for the last 3 years.  They use condoms off and on.  She has a history of chlamydia, gonorrhea, trichomonas.  She has no galactorrhea, headaches, or hot flashes.  Patient had pituitary labs checked and she was found to have a low normal estrogen and a low prolactin.  A UPT was negative repeatedly, with the last negative result being in 12/2019. 12/10/2019: Normal TSH 01/09/2020: Prolactin 2.8, Estradiol ***, FSH 5.8.  Pituitary MRI (01/30/2020): Normal, no pituitary masses.  Vaginal exam at her visit with OB/GYN showed an estrogenized vagina.  - weight gain - fatigue - constipation - dry skin - hair falling - depression  Pt. also has a history of ***.    No excessive exercise.  No restrictive diet.  She does not describe increased stress in her life.  She has not had any  fractures. She admits for marijuana use.  No IBS symptoms.  No excess hair or acne.  ROS: Constitutional: no weight gain/loss, no fatigue, no subjective hyperthermia/hypothermia Eyes: no blurry vision, no xerophthalmia ENT: no sore throat, no nodules palpated in neck, no dysphagia/odynophagia, no hoarseness Cardiovascular: no CP/SOB/palpitations/leg swelling Respiratory: no cough/SOB Gastrointestinal: no N/V/D/C Musculoskeletal: no muscle/joint aches Skin: no rashes Neurological: no tremors/numbness/tingling/dizziness Psychiatric: no depression/anxiety  PE: There were no vitals taken for this visit. Wt Readings from Last 3 Encounters:  01/09/20 96 lb (43.5 kg)  01/02/20 97 lb 2 oz (44.1 kg)  12/10/19 97 lb 4 oz (44.1 kg)   Constitutional: underweight, in NAD Eyes: PERRLA, EOMI, no exophthalmos ENT: moist mucous membranes, no thyromegaly, no cervical lymphadenopathy Cardiovascular: RRR, No MRG Respiratory: CTA B Gastrointestinal: abdomen soft, NT, ND, BS+ Musculoskeletal: no deformities, strength intact in all 4 Skin: moist, warm, no rashes Neurological: no tremor with outstretched hands, DTR normal in all 4  ASSESSMENT: 1.  Secondary amenorrhea  PLAN:  1.  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  Carlus Pavlov, MD PhD [facility] Endocrinology

## 2020-04-21 ENCOUNTER — Encounter: Payer: Self-pay | Admitting: Obstetrics and Gynecology

## 2020-04-21 ENCOUNTER — Telehealth: Payer: Self-pay

## 2020-04-21 ENCOUNTER — Ambulatory Visit: Payer: Self-pay | Admitting: Obstetrics and Gynecology

## 2020-04-21 NOTE — Telephone Encounter (Signed)
Patient did not keep office visit for today.  °

## 2020-04-21 NOTE — Telephone Encounter (Signed)
Call placed to patient x2 to number on file. Automated message, "number temporarily not in service".   MyChart message to patient.

## 2020-04-22 NOTE — Telephone Encounter (Signed)
Patient to return for [facility] for chlamydia.   Call placed to patients work number, spoke with patient. Mobile number updated. Patient states  Insurance has changed, has not received new card. Patient declines to schedule f/u at this time, states she will return call at a later date to schedule.   Routing to provider for final review. Patient is agreeable to disposition. Will close encounter.

## 2020-09-12 ENCOUNTER — Other Ambulatory Visit: Payer: Self-pay

## 2020-09-12 ENCOUNTER — Ambulatory Visit (INDEPENDENT_AMBULATORY_CARE_PROVIDER_SITE_OTHER): Payer: No Typology Code available for payment source | Admitting: Family Medicine

## 2020-09-12 ENCOUNTER — Encounter: Payer: Self-pay | Admitting: Family Medicine

## 2020-09-12 ENCOUNTER — Other Ambulatory Visit (HOSPITAL_COMMUNITY)
Admission: RE | Admit: 2020-09-12 | Discharge: 2020-09-12 | Disposition: A | Discharge status: Home / Self Care | Payer: No Typology Code available for payment source | Source: Ambulatory Visit | Attending: Family Medicine | Admitting: Family Medicine

## 2020-09-12 VITALS — BP 102/61 | HR 67 | Temp 99.1°F | Ht 61.0 in | Wt 99.0 lb

## 2020-09-12 DIAGNOSIS — R7989 Other specified abnormal findings of blood chemistry: Secondary | ICD-10-CM | POA: Diagnosis not present

## 2020-09-12 DIAGNOSIS — N941 Unspecified dyspareunia: Secondary | ICD-10-CM | POA: Diagnosis not present

## 2020-09-12 DIAGNOSIS — R55 Syncope and collapse: Secondary | ICD-10-CM

## 2020-09-12 DIAGNOSIS — N911 Secondary amenorrhea: Secondary | ICD-10-CM

## 2020-09-12 NOTE — Patient Instructions (Addendum)
Referred you to endocrine. They will call you to get you scheduled for further evaluation on possible endocrine order causing your periods to be abnormal.    We will call you with lab results on Monday.

## 2020-09-12 NOTE — Progress Notes (Signed)
[name]     Patient ID: Elizabeth Mercer, female  DOB: [birth year], 25 y.o.   MRN: [id number] Patient Care Team    Relationship Specialty Notifications Start End  Natalia Leatherwood, DO PCP - General Family Medicine  12/10/19   Romualdo Bolk, MD Consulting Physician Obstetrics and Gynecology  01/17/20     Chief Complaint  Patient presents with  . Amenorrhea    Pt last few cycles has been 1/29-2/2; 2/12-14; very light; pt states the intercourse has been painful and would have cramps afterwards    Subjective: Elizabeth Mercer is a 25 y.o.  female present for  Amenorrhea/DUB:  Patient presents today to discuss her menstrual cycles.  Patient had presented last year in May with amenorrhea of approximately 3 years or more.  She had been referred to gynecology and had a normal pelvic exam.  Ultrasound showed PCOS like follicles within the ovaries.  She was found to have low prolactin and FSH on labs at gynecology.  MRI of the brain had been completed and normal.  She had been referred to endocrine from her gynecology's office, to work-up possible hypopituitarism, however she has had a change in phone number since then and did not receive the message.  Presents today and states that she started a small amount of spotting on January 29 that lasted about 4 days.  And a reoccurrence 2 weeks later for approximately 2 days of light spotting.  She had intercourse at the beginning of January that was "uncomfortable ".  She felt like there was cramping occurring during intercourse.  She has not had intercourse since.  She denies any vaginal discharge or lesions.  She denies any further episodes of abdominal pain or cramping.  She would like tested for STDs today. Prior note:  Pt reports she did take the provera for 10 days. She had menstrual cramping about 4 days after completing provera. Cramps last a few days. She did not have any menstrual bleeding.  Prior note:  Patient presents today for new patient  establishment desiring birth control counseling.  She reports her last menstrual cycle was 2 to 3 years ago.  Patient has had normal menstrual cycle leading up to the start of Depo-Provera in which she had infrequent cycles.  She discontinued the Depo-Provera 2 to 3 years ago and has never restarted her menstrual cycle.  Patient has not had a Pap smear in the past.  She is sexually active.  Infrequent condom use.  Her electronic medical record review she has had a positive STD test for gonorrhea September 2020.  Patient reports she is concerned about her fertility since she has not had a.  In so long.  She denies any fever, chills, nausea, vomit, fatigue or breast tenderness.  Syncope:  At the end of our appointment today patient mentions she has been passing out every couple of months.  She reports she eats multiple times a day.  Last time she passed out it occurred at [facility] when she was getting ready to eat.  She had already eaten prior in that day.  EMS had been called on that event and she states they told her "everything was normal.  "When she passes out she loses consciousness for about 5 minutes.  She denies any bladder or bowel dysfunction during this time.  She has never had seizure-like activity witnessed during these events.  She denies any postictal symptoms. She states the first symptom will be she will have blurriness in her vision  and then she feels very hot and flushed, may break out in a sweat and then she passes out.  She denies any chest pain or shortness of breath during these events.  Depression screen [facility] 2/9 09/12/2020  Decreased Interest 0  Down, Depressed, Hopeless 0  PHQ - 2 Score 0   No flowsheet data found.     No flowsheet data found.   Immunization History  Administered Date(s) Administered  . Tdap 12/13/2017    No exam data present  Past Medical History:  Diagnosis Date  . Amenorrhea   . History of chlamydia   . History of gonorrhea    No Known  Allergies Past Surgical History:  Procedure Laterality Date  . WISDOM TOOTH EXTRACTION     Family History  Problem Relation Age of Onset  . Hypertension Mother   . Arthritis Mother   . Bipolar disorder Mother   . Asthma Mother   . Alcohol abuse Mother   . Hyperlipidemia Mother   . Mental illness Mother   . Diabetes Maternal Grandmother   . Hypertension Maternal Grandmother   . Arthritis Maternal Grandmother   . Asthma Maternal Grandmother   . Hyperlipidemia Maternal Grandmother   . Kidney failure Maternal Grandfather   . Alcohol abuse Maternal Grandfather   . Arthritis Maternal Grandfather   . Depression Maternal Grandfather   . Diabetes Maternal Grandfather   . Heart disease Maternal Grandfather   . Heart attack Maternal Grandfather    Social History   Social History Narrative   Marital status/children/pets: Single.    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    Safety:      -smoke alarm in the home:Yes     - wears seatbelt: Yes     - Feels safe in their relationships: Yes     All past medical history, surgical history, allergies, family history, immunizations andmedications were updated in the EMR today and reviewed under the history and medication portions of their EMR.     No results found.   ROS: 14 pt review of systems performed and negative (unless mentioned in an HPI)  Objective: BP 102/61   Pulse 67   Temp 99.1 F (37.3 C) (Oral)   Ht 5\' 1"  (1.549 m)   Wt 99 lb (44.9 kg)   LMP 08/30/2020   SpO2 100%   BMI 18.71 kg/m  Gen: Afebrile. No acute distress.  Nontoxic very pleasant female. HENT: AT. [place]. Eyes:Pupils Equal Round Reactive to light, Extraocular movements intact,  Conjunctiva without redness, discharge or icterus. Neck/lymp/endocrine: Supple, no lymphadenopathy, no thyromegaly CV: RRR no murmur, no edema, +2/4 P posterior tibialis pulses Chest: CTAB, no wheeze or crackles Abd: Soft.. NTND. BS present.  No masses palpated.   Neuro:  Normal gait. PERLA. EOMi. Alert. Oriented x3 Psych: Normal affect, dress and demeanor. Normal speech. Normal thought content and judgment.     Assessment/plan: Earnie Rockhold is a 25 y.o. female present for est care Amenorrhea/DU B/dyspareunia Female patient with normal menstrual cycles leading up to start of Depo-Provera and then amenorrhea since her  discontinuation of Depo-Provera 3 years ago.,  Until approximately 4 weeks ago she had 2 to 3 days of spotting and then repeat spotting for 2 days 2 weeks later. Failed 10 d progesterone challenge-June 2021 Established with GYN FSH, E2, prolactin collected today  Syncope: Seems to be chronic in nature over the last 2 years.  Possible vasovagal syndrome. CBC, CMP, TSH, iron panel collected today No orders  of the defined types were placed in this encounter.  No orders of the defined types were placed in this encounter.  Referral Orders  No referral(s) requested today     Note is dictated utilizing voice recognition software. Although note has been proof read prior to signing, occasional typographical errors still can be missed. If any questions arise, please do not hesitate to call for verification.  Electronically signed by: Felix Pacini, DO [facility] [place]

## 2020-09-13 LAB — CBC WITH DIFFERENTIAL/PLATELET
Absolute Monocytes: 564 cells/uL (ref 200–950)
Basophils Absolute: 51 cells/uL (ref 0–200)
Basophils Relative: 0.9 %
Eosinophils Absolute: 80 cells/uL (ref 15–500)
Eosinophils Relative: 1.4 %
HCT: 37.1 % (ref 35.0–45.0)
Hemoglobin: 12.3 g/dL (ref 11.7–15.5)
Lymphs Abs: 1761 cells/uL (ref 850–3900)
MCH: 29 pg (ref 27.0–33.0)
MCHC: 33.2 g/dL (ref 32.0–36.0)
MCV: 87.5 fL (ref 80.0–100.0)
MPV: 10 fL (ref 7.5–12.5)
Monocytes Relative: 9.9 %
Neutro Abs: 3243 cells/uL (ref 1500–7800)
Neutrophils Relative %: 56.9 %
Platelets: 256 10*3/uL (ref 140–400)
RBC: 4.24 10*6/uL (ref 3.80–5.10)
RDW: 12.5 % (ref 11.0–15.0)
Total Lymphocyte: 30.9 %
WBC: 5.7 10*3/uL (ref 3.8–10.8)

## 2020-09-13 LAB — COMPREHENSIVE METABOLIC PANEL
AG Ratio: 1.5 (calc) (ref 1.0–2.5)
ALT: 11 U/L (ref 6–29)
AST: 17 U/L (ref 10–30)
Albumin: 4.4 g/dL (ref 3.6–5.1)
Alkaline phosphatase (APISO): 43 U/L (ref 31–125)
BUN: 11 mg/dL (ref 7–25)
CO2: 25 mmol/L (ref 20–32)
Calcium: 9.3 mg/dL (ref 8.6–10.2)
Chloride: 105 mmol/L (ref 98–110)
Creat: 0.87 mg/dL (ref 0.50–1.10)
Globulin: 3 g/dL (calc) (ref 1.9–3.7)
Glucose, Bld: 127 mg/dL — ABNORMAL HIGH (ref 65–99)
Potassium: 3.9 mmol/L (ref 3.5–5.3)
Sodium: 138 mmol/L (ref 135–146)
Total Bilirubin: 0.5 mg/dL (ref 0.2–1.2)
Total Protein: 7.4 g/dL (ref 6.1–8.1)

## 2020-09-13 LAB — IRON,TIBC AND FERRITIN PANEL
%SAT: 17 % (calc) (ref 16–45)
Ferritin: 19 ng/mL (ref 16–154)
Iron: 57 ug/dL (ref 40–190)
TIBC: 341 mcg/dL (calc) (ref 250–450)

## 2020-09-13 LAB — PROLACTIN: Prolactin: 3.7 ng/mL

## 2020-09-13 LAB — TSH: TSH: 0.57 mIU/L

## 2020-09-13 LAB — T4, FREE: Free T4: 1.1 ng/dL (ref 0.8–1.8)

## 2020-09-13 LAB — ESTRADIOL: Estradiol: 38 pg/mL

## 2020-09-13 LAB — FOLLICLE STIMULATING HORMONE: FSH: 6.3 m[IU]/mL

## 2020-09-16 LAB — URINE CYTOLOGY ANCILLARY ONLY
Chlamydia: NEGATIVE
Comment: NEGATIVE
Comment: NORMAL
Neisseria Gonorrhea: NEGATIVE

## 2020-09-19 ENCOUNTER — Ambulatory Visit (HOSPITAL_COMMUNITY)
Admission: RE | Admit: 2020-09-19 | Discharge: 2020-09-19 | Disposition: A | Discharge status: Home / Self Care | Payer: No Typology Code available for payment source | Source: Ambulatory Visit | Attending: Internal Medicine | Admitting: Internal Medicine

## 2020-09-19 ENCOUNTER — Other Ambulatory Visit: Payer: Self-pay

## 2020-09-19 ENCOUNTER — Encounter (HOSPITAL_COMMUNITY): Payer: Self-pay

## 2020-09-19 VITALS — BP 106/63 | HR 67 | Temp 99.2°F | Resp 15

## 2020-09-19 DIAGNOSIS — N751 Abscess of Bartholin's gland: Secondary | ICD-10-CM | POA: Diagnosis not present

## 2020-09-19 MED ORDER — DOXYCYCLINE HYCLATE 100 MG PO CAPS: 100.0000 mg | ORAL_CAPSULE | Freq: Two times a day (BID) | ORAL | 0 refills | Status: AC

## 2020-09-19 NOTE — ED Provider Notes (Signed)
[facility]    CSN: [id number] Arrival date & time: 09/19/20  1826      History   Chief Complaint Chief Complaint  Patient presents with  . Vaginal Pain  . Vaginal Discharge    HPI Elizabeth Mercer is a 25 y.o. female.   HPI   Vaginal Pain: Pt states that for the past 3 days she has had vaginal pain.  She states that as of today she has had purulent green/yellow discharge and some relief of pain but states that it does hurt to walk.  She has applied ice to the area which has helped some.  No known fevers but she has a low-grade fever in office today.  No vomiting, pelvic pain or vaginal discharge.  She does not use tampons.  She states that she was recently screened for STIs this week and her results were all "negative ".  Per patient.  Past Medical History:  Diagnosis Date  . Amenorrhea   . History of chlamydia   . History of gonorrhea     Patient Active Problem List   Diagnosis Date Noted  . History of gonorrhea   . History of chlamydia   . Birth control counseling 12/10/2019  . Amenorrhea 12/10/2019    Past Surgical History:  Procedure Laterality Date  . WISDOM TOOTH EXTRACTION      OB History    Gravida  0   Para  0   Term  0   Preterm  0   AB  0   Living  0     SAB  0   IAB  0   Ectopic  0   Multiple  0   Live Births  0            Home Medications    Prior to Admission medications   Medication Sig Start Date End Date Taking? Authorizing Provider  doxycycline (VIBRAMYCIN) 100 MG capsule Take 1 capsule (100 mg total) by mouth 2 (two) times daily. 09/19/20  Yes Rushie Chestnut, PA-C    Family History Family History  Problem Relation Age of Onset  . Hypertension Mother   . Arthritis Mother   . Bipolar disorder Mother   . Asthma Mother   . Alcohol abuse Mother   . Hyperlipidemia Mother   . Mental illness Mother   . Diabetes Maternal Grandmother   . Hypertension Maternal Grandmother   . Arthritis Maternal Grandmother    . Asthma Maternal Grandmother   . Hyperlipidemia Maternal Grandmother   . Kidney failure Maternal Grandfather   . Alcohol abuse Maternal Grandfather   . Arthritis Maternal Grandfather   . Depression Maternal Grandfather   . Diabetes Maternal Grandfather   . Heart disease Maternal Grandfather   . Heart attack Maternal Grandfather     Social History Social History   Tobacco Use  . Smoking status: Never Smoker  . Smokeless tobacco: Never Used  Vaping Use  . Vaping Use: Every day  . Substances: Nicotine, Flavoring  Substance Use Topics  . Alcohol use: No  . Drug use: Yes    Types: Marijuana     Allergies   Patient has no known allergies.   Review of Systems Review of Systems  As stated above in HPI Physical Exam Triage Vital Signs ED Triage Vitals  Enc Vitals Group     BP 09/19/20 1847 106/63     Pulse Rate 09/19/20 1847 67     Resp 09/19/20 1847 15  Temp 09/19/20 1847 99.2 F (37.3 C)     Temp Source 09/19/20 1847 Oral     SpO2 09/19/20 1847 100 %     Weight --      Height --      Head Circumference --      Peak Flow --      Pain Score 09/19/20 1845 5     Pain Loc --      Pain Edu? --      Excl. in GC? --    No data found.  Updated Vital Signs BP 106/63 (BP Location: Right Arm)   Pulse 67   Temp 99.2 F (37.3 C) (Oral)   Resp 15   LMP 09/12/2020 (Exact Date)   SpO2 100%   Physical Exam Vitals and nursing note reviewed. Chaperone present: CHaperone declined.  Constitutional:      General: She is not in acute distress.    Appearance: Normal appearance. She is not ill-appearing, toxic-appearing or diaphoretic.  Cardiovascular:     Rate and Rhythm: Normal rate and regular rhythm.     Heart sounds: Normal heart sounds.  Genitourinary:    Comments: 3cm ruptured right Bartholin cyst with green purulent discharge Lymphadenopathy:     Cervical: No cervical adenopathy.  Neurological:     Mental Status: She is alert.      UC Treatments /  Results  Labs (all labs ordered are listed, but only abnormal results are displayed) Labs Reviewed - No data to display  EKG   Radiology No results found.  Procedures Procedures (including critical care time)  Medications Ordered in UC Medications - No data to display  Initial Impression / Assessment and Plan / UC Course  I have reviewed the triage vital signs and the nursing notes.  Pertinent labs & imaging results that were available during my care of the patient were reviewed by me and considered in my medical decision making (see chart for details).     New.  Discussed with patient.  As the area has already ruptured and is draining well we will need to add on antibiotics at this time.  Discussed sitz bath as and red flag symptoms along with common potential side effects and precautions with her antibiotic. Final Clinical Impressions(s) / UC Diagnoses   Final diagnoses:  Bartholin's gland abscess   Discharge Instructions   None    ED Prescriptions    Medication Sig Dispense Auth. Provider   doxycycline (VIBRAMYCIN) 100 MG capsule Take 1 capsule (100 mg total) by mouth 2 (two) times daily. 20 capsule Rushie Chestnut, [place]     PDMP not reviewed this encounter.   Rushie Chestnut, [place] 09/19/20 1906

## 2020-09-19 NOTE — ED Triage Notes (Signed)
Pt reports vaginal pain x 3 days; yellow/green vaginal discharge since this morning. Pain is worse when walking.States pain improved when applying ice.

## 2020-11-09 ENCOUNTER — Other Ambulatory Visit: Payer: Self-pay

## 2020-11-09 ENCOUNTER — Emergency Department (HOSPITAL_BASED_OUTPATIENT_CLINIC_OR_DEPARTMENT_OTHER): Payer: No Typology Code available for payment source

## 2020-11-09 ENCOUNTER — Ambulatory Visit (HOSPITAL_COMMUNITY)
Admission: EM | Admit: 2020-11-09 | Discharge: 2020-11-09 | Disposition: A | Discharge status: Other Acute Inpatient Hospital | Payer: No Typology Code available for payment source

## 2020-11-09 ENCOUNTER — Encounter (HOSPITAL_BASED_OUTPATIENT_CLINIC_OR_DEPARTMENT_OTHER): Payer: Self-pay | Admitting: Obstetrics and Gynecology

## 2020-11-09 ENCOUNTER — Emergency Department (HOSPITAL_BASED_OUTPATIENT_CLINIC_OR_DEPARTMENT_OTHER)
Admission: EM | Admit: 2020-11-09 | Discharge: 2020-11-09 | Disposition: A | Discharge status: Home / Self Care | Payer: No Typology Code available for payment source | Attending: Emergency Medicine | Admitting: Emergency Medicine

## 2020-11-09 DIAGNOSIS — S0990XA Unspecified injury of head, initial encounter: Secondary | ICD-10-CM | POA: Diagnosis present

## 2020-11-09 DIAGNOSIS — S060X0A Concussion without loss of consciousness, initial encounter: Secondary | ICD-10-CM | POA: Insufficient documentation

## 2020-11-09 DIAGNOSIS — Y9389 Activity, other specified: Secondary | ICD-10-CM | POA: Insufficient documentation

## 2020-11-09 DIAGNOSIS — W01198A Fall on same level from slipping, tripping and stumbling with subsequent striking against other object, initial encounter: Secondary | ICD-10-CM | POA: Insufficient documentation

## 2020-11-09 DIAGNOSIS — M542 Cervicalgia: Secondary | ICD-10-CM | POA: Insufficient documentation

## 2020-11-09 MED ORDER — KETOROLAC TROMETHAMINE 60 MG/2ML IM SOLN
60.0000 mg | Freq: Once | INTRAMUSCULAR | Status: AC
  Administered 2020-11-09: 60 mg via INTRAMUSCULAR
  Filled 2020-11-09: qty 2

## 2020-11-09 NOTE — ED Triage Notes (Signed)
Patient reports around 10am yesterday she was playing with her dog and the dog got up under her feet and she fell back and hit her head on concrete. Patient denies LOC. Patient reports she is having severe head pain. Patient denies emesis.

## 2020-11-09 NOTE — ED Provider Notes (Signed)
[facility] EMERGENCY DEPT Provider Note   CSN: [id number] Arrival date & time: 11/09/20  1254     History Chief Complaint  Patient presents with  . Headache  . Fall    Elizabeth Mercer is a 25 y.o. female.  The history is provided by the patient.  Headache Pain location:  Occipital Quality:  Sharp Radiates to:  R neck and L neck Severity currently:  7/10 Severity at highest:  8/10 Onset quality:  Sudden Timing:  Constant Progression:  Unchanged Chronicity:  New Similar to prior headaches: no   Context comment:  Started after she fell and hit her head on the concrete yesterday morning at 10 AM Relieved by:  Nothing Worsened by:  Activity, light and neck movement Ineffective treatments:  None tried Associated symptoms: neck stiffness and photophobia   Associated symptoms: no blurred vision, no dizziness, no eye pain, no facial pain, no loss of balance, no myalgias and no vomiting   Fall Associated symptoms include headaches.       Past Medical History:  Diagnosis Date  . Amenorrhea   . History of chlamydia   . History of gonorrhea     Patient Active Problem List   Diagnosis Date Noted  . History of gonorrhea   . History of chlamydia   . Birth control counseling 12/10/2019  . Amenorrhea 12/10/2019    Past Surgical History:  Procedure Laterality Date  . WISDOM TOOTH EXTRACTION       OB History    Gravida  0   Para  0   Term  0   Preterm  0   AB  0   Living  0     SAB  0   IAB  0   Ectopic  0   Multiple  0   Live Births  0           Family History  Problem Relation Age of Onset  . Hypertension Mother   . Arthritis Mother   . Bipolar disorder Mother   . Asthma Mother   . Alcohol abuse Mother   . Hyperlipidemia Mother   . Mental illness Mother   . Diabetes Maternal Grandmother   . Hypertension Maternal Grandmother   . Arthritis Maternal Grandmother   . Asthma Maternal Grandmother   . Hyperlipidemia Maternal  Grandmother   . Kidney failure Maternal Grandfather   . Alcohol abuse Maternal Grandfather   . Arthritis Maternal Grandfather   . Depression Maternal Grandfather   . Diabetes Maternal Grandfather   . Heart disease Maternal Grandfather   . Heart attack Maternal Grandfather     Social History   Tobacco Use  . Smoking status: Never Smoker  . Smokeless tobacco: Never Used  Vaping Use  . Vaping Use: Every day  . Substances: Nicotine, Flavoring  Substance Use Topics  . Alcohol use: No  . Drug use: Yes    Types: Marijuana    Home Medications Prior to Admission medications   Medication Sig Start Date End Date Taking? Authorizing Provider  doxycycline (VIBRAMYCIN) 100 MG capsule Take 1 capsule (100 mg total) by mouth 2 (two) times daily. 09/19/20   Rushie Chestnut, PA-C    Allergies    Patient has no known allergies.  Review of Systems   Review of Systems  Eyes: Positive for photophobia. Negative for blurred vision and pain.  Gastrointestinal: Negative for vomiting.  Musculoskeletal: Positive for neck stiffness. Negative for myalgias.  Neurological: Positive for headaches. Negative for  dizziness and loss of balance.  All other systems reviewed and are negative.   Physical Exam Updated Vital Signs BP (S) (!) 89/62 Comment: RN aware; patient resting comfortably  Pulse 82   Temp 98.6 F (37 C) (Oral)   Resp 14   LMP 11/03/2020 (Exact Date)   SpO2 100%   Physical Exam Vitals and nursing note reviewed.  Constitutional:      General: She is not in acute distress.    Appearance: She is well-developed and normal weight.  HENT:     Head: Normocephalic and atraumatic.      Right Ear: Tympanic membrane normal.     Left Ear: Tympanic membrane normal.  Eyes:     Pupils: Pupils are equal, round, and reactive to light.  Cardiovascular:     Rate and Rhythm: Normal rate and regular rhythm.     Heart sounds: Normal heart sounds. No murmur heard. No friction rub.  Pulmonary:      Effort: Pulmonary effort is normal.     Breath sounds: Normal breath sounds. No wheezing or rales.  Abdominal:     General: Bowel sounds are normal. There is no distension.     Palpations: Abdomen is soft.     Tenderness: There is no abdominal tenderness. There is no guarding or rebound.  Musculoskeletal:        General: No tenderness. Normal range of motion.     Cervical back: Spinous process tenderness and muscular tenderness present.     Comments: No edema  Skin:    General: Skin is warm and dry.     Findings: No rash.  Neurological:     Mental Status: She is alert and oriented to person, place, and time. Mental status is at baseline.     Cranial Nerves: No cranial nerve deficit.     Sensory: No sensory deficit.     Motor: No weakness.     Gait: Gait normal.  Psychiatric:        Mood and Affect: Mood normal.        Behavior: Behavior normal.     ED Results / Procedures / Treatments   Labs (all labs ordered are listed, but only abnormal results are displayed) Labs Reviewed - No data to display  EKG None  Radiology CT Head Wo Contrast  Result Date: 11/09/2020 CLINICAL DATA:  Fall from standing with head and neck pain EXAM: CT HEAD WITHOUT CONTRAST CT CERVICAL SPINE WITHOUT CONTRAST TECHNIQUE: Multidetector CT imaging of the head and cervical spine was performed following the standard protocol without intravenous contrast. Multiplanar CT image reconstructions of the cervical spine were also generated. COMPARISON:  MR brain dated 01/30/2020. FINDINGS: CT HEAD FINDINGS Brain: No evidence of acute infarction, hemorrhage, hydrocephalus, extra-axial collection or mass lesion/mass effect. Vascular: No hyperdense vessel or unexpected calcification. Skull: Normal. Negative for fracture or focal lesion. Sinuses/Orbits: No acute finding. Other: None. CT CERVICAL SPINE FINDINGS Alignment: Normal. Skull base and vertebrae: No acute fracture. No primary bone lesion or focal pathologic  process. Soft tissues and spinal canal: No prevertebral fluid or swelling. No visible canal hematoma. Disc levels:  Preserved. Upper chest: Negative. Other: None. IMPRESSION: 1. No acute intracranial process. 2. No acute osseous injury in the cervical spine. Electronically Signed   By: Romona Curls M.D.   On: 11/09/2020 15:13   CT Cervical Spine Wo Contrast  Result Date: 11/09/2020 CLINICAL DATA:  Fall from standing with head and neck pain EXAM: CT HEAD WITHOUT CONTRAST CT  CERVICAL SPINE WITHOUT CONTRAST TECHNIQUE: Multidetector CT imaging of the head and cervical spine was performed following the standard protocol without intravenous contrast. Multiplanar CT image reconstructions of the cervical spine were also generated. COMPARISON:  MR brain dated 01/30/2020. FINDINGS: CT HEAD FINDINGS Brain: No evidence of acute infarction, hemorrhage, hydrocephalus, extra-axial collection or mass lesion/mass effect. Vascular: No hyperdense vessel or unexpected calcification. Skull: Normal. Negative for fracture or focal lesion. Sinuses/Orbits: No acute finding. Other: None. CT CERVICAL SPINE FINDINGS Alignment: Normal. Skull base and vertebrae: No acute fracture. No primary bone lesion or focal pathologic process. Soft tissues and spinal canal: No prevertebral fluid or swelling. No visible canal hematoma. Disc levels:  Preserved. Upper chest: Negative. Other: None. IMPRESSION: 1. No acute intracranial process. 2. No acute osseous injury in the cervical spine. Electronically Signed   By: Romona Curls M.D.   On: 11/09/2020 15:13    Procedures Procedures   Medications Ordered in ED Medications  ketorolac (TORADOL) injection 60 mg (60 mg Intramuscular Given 11/09/20 1427)    ED Course  I have reviewed the triage vital signs and the nursing notes.  Pertinent labs & imaging results that were available during my care of the patient were reviewed by me and considered in my medical decision making (see chart for  details).    MDM Rules/Calculators/A&P                         Patient presenting today with ongoing severe headache and neck pain after falling and hitting her head on the concrete yesterday when tripped by her dog.  She has not had any vomiting but has had a severe headache.  She denies any visual changes.  On exam she has significant neck tenderness with range of motion and palpation.  Contusion to the occipital portion of her head.  Patient is mildly hypotensive here with pressure of 89/62 which will need to be rechecked.  No neurologic findings on exam with reactive pupils normal TMs and no unilateral weakness or numbness.  Patient's speech is within normal limits.  Patient given IM pain control.  CT of head and neck are pending.  3:40 PM Head and cervical spine are neg.  Pt dx with concussion and given f/u and precautions.  MDM Number of Diagnoses or Management Options   Amount and/or Complexity of Data Reviewed Tests in the radiology section of CPT: ordered and reviewed Independent visualization of images, tracings, or specimens: yes     Final Clinical Impression(s) / ED Diagnoses Final diagnoses:  Concussion without loss of consciousness, initial encounter    Rx / DC Orders ED Discharge Orders    None       Gwyneth Sprout, MD 11/09/20 1540

## 2021-03-23 ENCOUNTER — Ambulatory Visit (HOSPITAL_COMMUNITY): Payer: No Typology Code available for payment source
